# Patient Record
Sex: Male | Born: 1945 | Race: White | Hispanic: No | Marital: Single | Smoking: Never smoker
Health system: Southern US, Community
[De-identification: ages and names within clinical notes are randomized; demographics above are authoritative.]

## PROBLEM LIST (undated history)

## (undated) DIAGNOSIS — Z789 Other specified health status: Secondary | ICD-10-CM

---

## 1968-09-08 HISTORY — PX: SPLENECTOMY, TOTAL: SHX788

## 2008-06-17 ENCOUNTER — Emergency Department (HOSPITAL_COMMUNITY): Admission: EM | Admit: 2008-06-17 | Discharge: 2008-06-17 | Payer: Self-pay | Admitting: Emergency Medicine

## 2012-01-21 ENCOUNTER — Ambulatory Visit (HOSPITAL_COMMUNITY)
Admission: RE | Admit: 2012-01-21 | Discharge: 2012-01-21 | Disposition: A | Discharge status: Home / Self Care | Payer: Medicare Other | Source: Ambulatory Visit | Attending: Family Medicine | Admitting: Family Medicine

## 2012-01-21 ENCOUNTER — Other Ambulatory Visit (HOSPITAL_COMMUNITY): Payer: Self-pay | Admitting: Family Medicine

## 2012-01-21 DIAGNOSIS — R05 Cough: Secondary | ICD-10-CM

## 2012-01-21 DIAGNOSIS — R062 Wheezing: Secondary | ICD-10-CM

## 2012-01-21 DIAGNOSIS — R059 Cough, unspecified: Secondary | ICD-10-CM | POA: Insufficient documentation

## 2012-04-09 ENCOUNTER — Ambulatory Visit (HOSPITAL_COMMUNITY)
Admission: RE | Admit: 2012-04-09 | Discharge: 2012-04-09 | Disposition: A | Discharge status: Home / Self Care | Payer: Medicare Other | Source: Ambulatory Visit | Attending: Family Medicine | Admitting: Family Medicine

## 2012-04-09 ENCOUNTER — Other Ambulatory Visit (HOSPITAL_COMMUNITY): Payer: Self-pay | Admitting: Family Medicine

## 2012-04-09 DIAGNOSIS — R05 Cough: Secondary | ICD-10-CM

## 2012-04-09 DIAGNOSIS — R059 Cough, unspecified: Secondary | ICD-10-CM | POA: Insufficient documentation

## 2012-11-03 ENCOUNTER — Encounter (HOSPITAL_COMMUNITY): Payer: Self-pay

## 2012-11-03 ENCOUNTER — Encounter (HOSPITAL_COMMUNITY)
Admission: RE | Admit: 2012-11-03 | Discharge: 2012-11-03 | Disposition: A | Discharge status: Home / Self Care | Payer: Medicare Other | Source: Ambulatory Visit | Attending: Ophthalmology | Admitting: Ophthalmology

## 2012-11-03 ENCOUNTER — Encounter (HOSPITAL_COMMUNITY): Payer: Self-pay | Admitting: Pharmacy Technician

## 2012-11-03 ENCOUNTER — Other Ambulatory Visit: Payer: Self-pay

## 2012-11-03 HISTORY — DX: Other specified health status: Z78.9

## 2012-11-03 NOTE — Patient Instructions (Addendum)
Your procedure is scheduled on:  11/11/12  Report to [facility] at 7:30 AM.  Call this number if you have problems the morning of surgery: [phone]   Remember:   Do not eat or drink:After Midnight.  Take these medicines the morning of surgery with A SIP OF WATER: None   Do not wear jewelry, make-up or nail polish.  Do not wear lotions, powders, or perfumes. You may wear deodorant.  Do not shave 48 hours prior to surgery. Men may shave face and neck.  Do not bring valuables to the hospital.  Contacts, dentures or bridgework may not be worn into surgery.  Leave suitcase in the car. After surgery it may be brought to your room.  For patients admitted to the hospital, checkout time is 11:00 AM the day of discharge.   Patients discharged the day of surgery will not be allowed to drive home.    Special Instructions: Start using your eye drops before surgery as directed by your eye doctor.   Please read over the following fact sheets that you were given: Anesthesia Post-op Instructions    Cataract Surgery  A cataract is a clouding of the lens of the eye. When a lens becomes cloudy, vision is reduced based on the degree and nature of the clouding. Surgery may be needed to improve vision. Surgery removes the cloudy lens and usually replaces it with a substitute lens (intraocular lens, IOL). LET YOUR EYE DOCTOR KNOW ABOUT:  Allergies to food or medicine.  Medicines taken including herbs, eyedrops, over-the-counter medicines, and creams.  Use of steroids (by mouth or creams).  Previous problems with anesthetics or numbing medicine.  History of bleeding problems or blood clots.  Previous surgery.  Other health problems, including diabetes and kidney problems.  Possibility of pregnancy, if this applies. RISKS AND COMPLICATIONS  Infection.  Inflammation of the eyeball (endophthalmitis) that can spread to both eyes (sympathetic ophthalmia).  Poor wound healing.  If an IOL is  inserted, it can later fall out of proper position. This is very uncommon.  Clouding of the part of your eye that holds an IOL in place. This is called an "after-cataract." These are uncommon, but easily treated. BEFORE THE PROCEDURE  Do not eat or drink anything except small amounts of water for 8 to 12 before your surgery, or as directed by your caregiver.  Unless you are told otherwise, continue any eyedrops you have been prescribed.  Talk to your primary caregiver about all other medicines that you take (both prescription and non-prescription). In some cases, you may need to stop or change medicines near the time of your surgery. This is most important if you are taking blood-thinning medicine.Do not stop medicines unless you are told to do so.  Arrange for someone to drive you to and from the procedure.  Do not put contact lenses in either eye on the day of your surgery. PROCEDURE There is more than one method for safely removing a cataract. Your doctor can explain the differences and help determine which is best for you. Phacoemulsification surgery is the most common form of cataract surgery.  An injection is given behind the eye or eyedrops are given to make this a painless procedure.  A small cut (incision) is made on the edge of the clear, dome-shaped surface that covers the front of the eye (cornea).  A tiny probe is painlessly inserted into the eye. This device gives off ultrasound waves that soften and break up the cloudy  center of the lens. This makes it easier for the cloudy lens to be removed by suction.  An IOL may be implanted.  The normal lens of the eye is covered by a clear capsule. Part of that capsule is intentionally left in the eye to support the IOL.  Your surgeon may or may not use stitches to close the incision. There are other forms of cataract surgery that require a larger incision and stiches to close the eye. This approach is taken in cases where the  doctor feels that the cataract cannot be easily removed using phacoemulsification. AFTER THE PROCEDURE  When an IOL is implanted, it does not need care. It becomes a permanent part of your eye and cannot be seen or felt.  Your doctor will schedule follow-up exams to check on your progress.  Review your other medicines with your doctor to see which can be resumed after surgery.  Use eyedrops or take medicine as prescribed by your doctor. Document Released: 08/14/2011 Document Revised: 11/17/2011 Document Reviewed: 08/14/2011 [facility] Patient Information 2013 [place], [place].    PATIENT INSTRUCTIONS POST-ANESTHESIA  IMMEDIATELY FOLLOWING SURGERY:  Do not drive or operate machinery for the first twenty four hours after surgery.  Do not make any important decisions for twenty four hours after surgery or while taking narcotic pain medications or sedatives.  If you develop intractable nausea and vomiting or a severe headache please notify your doctor immediately.  FOLLOW-UP:  Please make an appointment with your surgeon as instructed. You do not need to follow up with anesthesia unless specifically instructed to do so.  WOUND CARE INSTRUCTIONS (if applicable):  Keep a dry clean dressing on the anesthesia/puncture wound site if there is drainage.  Once the wound has quit draining you may leave it open to air.  Generally you should leave the bandage intact for twenty four hours unless there is drainage.  If the epidural site drains for more than 36-48 hours please call the anesthesia department.  QUESTIONS?:  Please feel free to call your physician or the hospital operator if you have any questions, and they will be happy to assist you.

## 2012-11-10 MED ORDER — CYCLOPENTOLATE-PHENYLEPHRINE 0.2-1 % OP SOLN
OPHTHALMIC | Status: AC
  Filled 2012-11-10: qty 2

## 2012-11-10 MED ORDER — PHENYLEPHRINE HCL 2.5 % OP SOLN
OPHTHALMIC | Status: AC
  Filled 2012-11-10: qty 2

## 2012-11-10 MED ORDER — NEOMYCIN-POLYMYXIN-DEXAMETH 3.5-10000-0.1 OP OINT
TOPICAL_OINTMENT | OPHTHALMIC | Status: AC
  Filled 2012-11-10: qty 3.5

## 2012-11-10 MED ORDER — TETRACAINE HCL 0.5 % OP SOLN
OPHTHALMIC | Status: AC
  Filled 2012-11-10: qty 2

## 2012-11-10 MED ORDER — LIDOCAINE HCL 3.5 % OP GEL
OPHTHALMIC | Status: AC
  Filled 2012-11-10: qty 5

## 2012-11-10 MED ORDER — LIDOCAINE HCL (PF) 1 % IJ SOLN
INTRAMUSCULAR | Status: AC
  Filled 2012-11-10: qty 2

## 2012-11-11 ENCOUNTER — Ambulatory Visit (HOSPITAL_COMMUNITY)
Admission: RE | Admit: 2012-11-11 | Discharge: 2012-11-11 | Disposition: A | Discharge status: Home / Self Care | Payer: Medicare Other | Source: Ambulatory Visit | Attending: Ophthalmology | Admitting: Ophthalmology

## 2012-11-11 ENCOUNTER — Encounter (HOSPITAL_COMMUNITY): Payer: Self-pay | Admitting: Anesthesiology

## 2012-11-11 ENCOUNTER — Ambulatory Visit (HOSPITAL_COMMUNITY): Payer: Medicare Other | Admitting: Anesthesiology

## 2012-11-11 ENCOUNTER — Encounter (HOSPITAL_COMMUNITY): Payer: Self-pay | Admitting: *Deleted

## 2012-11-11 ENCOUNTER — Encounter (HOSPITAL_COMMUNITY)
Admission: RE | Disposition: A | Discharge status: Home / Self Care | Payer: Self-pay | Source: Ambulatory Visit | Attending: Ophthalmology

## 2012-11-11 DIAGNOSIS — I10 Essential (primary) hypertension: Secondary | ICD-10-CM | POA: Insufficient documentation

## 2012-11-11 DIAGNOSIS — H2589 Other age-related cataract: Secondary | ICD-10-CM | POA: Insufficient documentation

## 2012-11-11 DIAGNOSIS — Z79899 Other long term (current) drug therapy: Secondary | ICD-10-CM | POA: Insufficient documentation

## 2012-11-11 DIAGNOSIS — Z01812 Encounter for preprocedural laboratory examination: Secondary | ICD-10-CM | POA: Insufficient documentation

## 2012-11-11 HISTORY — PX: CATARACT EXTRACTION W/PHACO: SHX586

## 2012-11-11 SURGERY — PHACOEMULSIFICATION, CATARACT, WITH IOL INSERTION
Anesthesia: Monitor Anesthesia Care | Site: Eye | Laterality: Right | Wound class: Clean

## 2012-11-11 MED ORDER — TETRACAINE HCL 0.5 % OP SOLN
1.0000 [drp] | OPHTHALMIC | Status: AC
  Administered 2012-11-11 (×3): 1 [drp] via OPHTHALMIC

## 2012-11-11 MED ORDER — EPINEPHRINE HCL 1 MG/ML IJ SOLN
INTRAOCULAR | Status: DC | PRN
  Administered 2012-11-11: 09:00:00

## 2012-11-11 MED ORDER — NEOMYCIN-POLYMYXIN-DEXAMETH 0.1 % OP OINT
TOPICAL_OINTMENT | OPHTHALMIC | Status: DC | PRN
  Administered 2012-11-11: 1 via OPHTHALMIC

## 2012-11-11 MED ORDER — PHENYLEPHRINE HCL 2.5 % OP SOLN
1.0000 [drp] | OPHTHALMIC | Status: AC
  Administered 2012-11-11 (×3): 1 [drp] via OPHTHALMIC

## 2012-11-11 MED ORDER — POVIDONE-IODINE 5 % OP SOLN
OPHTHALMIC | Status: DC | PRN
  Administered 2012-11-11: 1 via OPHTHALMIC

## 2012-11-11 MED ORDER — LIDOCAINE HCL (PF) 1 % IJ SOLN
INTRAMUSCULAR | Status: DC | PRN
  Administered 2012-11-11: .5 mL

## 2012-11-11 MED ORDER — MIDAZOLAM HCL 2 MG/2ML IJ SOLN
INTRAMUSCULAR | Status: AC
  Filled 2012-11-11: qty 2

## 2012-11-11 MED ORDER — LIDOCAINE 3.5 % OP GEL OPTIME - NO CHARGE
OPHTHALMIC | Status: DC | PRN
  Administered 2012-11-11: 1 [drp] via OPHTHALMIC

## 2012-11-11 MED ORDER — LIDOCAINE HCL 3.5 % OP GEL
1.0000 "application " | Freq: Once | OPHTHALMIC | Status: AC
  Administered 2012-11-11: 1 via OPHTHALMIC

## 2012-11-11 MED ORDER — CYCLOPENTOLATE-PHENYLEPHRINE 0.2-1 % OP SOLN
1.0000 [drp] | OPHTHALMIC | Status: AC
  Administered 2012-11-11 (×3): 1 [drp] via OPHTHALMIC

## 2012-11-11 MED ORDER — MIDAZOLAM HCL 2 MG/2ML IJ SOLN
1.0000 mg | INTRAMUSCULAR | Status: DC | PRN
  Administered 2012-11-11: 2 mg via INTRAVENOUS

## 2012-11-11 MED ORDER — BSS IO SOLN
INTRAOCULAR | Status: DC | PRN
  Administered 2012-11-11: 15 mL via INTRAOCULAR

## 2012-11-11 MED ORDER — LACTATED RINGERS IV SOLN
INTRAVENOUS | Status: DC
  Administered 2012-11-11: 08:00:00 via INTRAVENOUS

## 2012-11-11 MED ORDER — EPINEPHRINE HCL 1 MG/ML IJ SOLN
INTRAMUSCULAR | Status: AC
  Filled 2012-11-11: qty 1

## 2012-11-11 MED ORDER — NA HYALUR & NA CHOND-NA HYALUR 0.55-0.5 ML IO KIT
PACK | INTRAOCULAR | Status: DC | PRN
  Administered 2012-11-11: 1 via OPHTHALMIC

## 2012-11-11 SURGICAL SUPPLY — 32 items
CAPSULAR TENSION RING-AMO (OPHTHALMIC RELATED) IMPLANT
CLOTH BEACON ORANGE TIMEOUT ST (SAFETY) ×2 IMPLANT
EYE SHIELD UNIVERSAL CLEAR (GAUZE/BANDAGES/DRESSINGS) ×2 IMPLANT
GLOVE BIO SURGEON STRL SZ 6.5 (GLOVE) IMPLANT
GLOVE BIOGEL PI IND STRL 6.5 (GLOVE) ×2 IMPLANT
GLOVE BIOGEL PI IND STRL 7.0 (GLOVE) IMPLANT
GLOVE BIOGEL PI IND STRL 7.5 (GLOVE) IMPLANT
GLOVE BIOGEL PI INDICATOR 6.5 (GLOVE) ×2
GLOVE BIOGEL PI INDICATOR 7.0 (GLOVE)
GLOVE BIOGEL PI INDICATOR 7.5 (GLOVE)
GLOVE ECLIPSE 6.5 STRL STRAW (GLOVE) IMPLANT
GLOVE ECLIPSE 7.0 STRL STRAW (GLOVE) IMPLANT
GLOVE ECLIPSE 7.5 STRL STRAW (GLOVE) IMPLANT
GLOVE EXAM NITRILE LRG STRL (GLOVE) IMPLANT
GLOVE EXAM NITRILE MD LF STRL (GLOVE) ×2 IMPLANT
GLOVE SKINSENSE NS SZ6.5 (GLOVE)
GLOVE SKINSENSE NS SZ7.0 (GLOVE)
GLOVE SKINSENSE STRL SZ6.5 (GLOVE) IMPLANT
GLOVE SKINSENSE STRL SZ7.0 (GLOVE) IMPLANT
KIT VITRECTOMY (OPHTHALMIC RELATED) IMPLANT
PAD ARMBOARD 7.5X6 YLW CONV (MISCELLANEOUS) ×2 IMPLANT
PROC W NO LENS (INTRAOCULAR LENS)
PROC W SPEC LENS (INTRAOCULAR LENS)
PROCESS W NO LENS (INTRAOCULAR LENS) IMPLANT
PROCESS W SPEC LENS (INTRAOCULAR LENS) IMPLANT
RING MALYGIN (MISCELLANEOUS) IMPLANT
SIGHTPATH CAT PROC W REG LENS (Ophthalmic Related) ×2 IMPLANT
SYR TB 1ML LL NO SAFETY (SYRINGE) ×2 IMPLANT
TAPE SURG TRANSPORE 1 IN (GAUZE/BANDAGES/DRESSINGS) ×1 IMPLANT
TAPE SURGICAL TRANSPORE 1 IN (GAUZE/BANDAGES/DRESSINGS) ×1
VISCOELASTIC ADDITIONAL (OPHTHALMIC RELATED) IMPLANT
WATER STERILE IRR 250ML POUR (IV SOLUTION) ×2 IMPLANT

## 2012-11-11 NOTE — Anesthesia Procedure Notes (Signed)
Procedure Name: MAC Date/Time: 11/11/2012 9:00 AM Performed by: Franco Nones Pre-anesthesia Checklist: Patient identified, Emergency Drugs available, Suction available, Timeout performed and Patient being monitored Patient Re-evaluated:Patient Re-evaluated prior to inductionOxygen Delivery Method: Nasal Cannula

## 2012-11-11 NOTE — Transfer of Care (Signed)
Immediate Anesthesia Transfer of Care Note  Patient: Spencer Vargas  Procedure(s) Performed: Procedure(s) (LRB): CATARACT EXTRACTION PHACO AND INTRAOCULAR LENS PLACEMENT (IOC) (Right)  Patient Location: Shortstay  Anesthesia Type: MAC  Level of Consciousness: awake  Airway & Oxygen Therapy: Patient Spontanous Breathing   Post-op Assessment: Report given to PACU RN, Post -op Vital signs reviewed and stable and Patient moving all extremities  Post vital signs: Reviewed and stable  Complications: No apparent anesthesia complications

## 2012-11-11 NOTE — Anesthesia Postprocedure Evaluation (Signed)
  Anesthesia Post-op Note  Patient: Spencer Vargas  Procedure(s) Performed: Procedure(s) (LRB): CATARACT EXTRACTION PHACO AND INTRAOCULAR LENS PLACEMENT (IOC) (Right)  Patient Location:  Short Stay  Anesthesia Type: MAC  Level of Consciousness: awake  Airway and Oxygen Therapy: Patient Spontanous Breathing  Post-op Pain: none  Post-op Assessment: Post-op Vital signs reviewed, Patient's Cardiovascular Status Stable, Respiratory Function Stable, Patent Airway, No signs of Nausea or vomiting and Pain level controlled  Post-op Vital Signs: Reviewed and stable  Complications: No apparent anesthesia complications

## 2012-11-11 NOTE — Anesthesia Preprocedure Evaluation (Signed)
Anesthesia Evaluation  Patient identified by MRN, date of birth, ID band Patient awake    Reviewed: Allergy & Precautions, H&P , NPO status , Patient's Chart, lab work & pertinent test results  History of Anesthesia Complications Negative for: history of anesthetic complications  Airway Mallampati: II      Dental  (+) Teeth Intact   Pulmonary neg pulmonary ROS,  breath sounds clear to auscultation        Cardiovascular hypertension (borderline), Rhythm:Regular Rate:Normal     Neuro/Psych    GI/Hepatic negative GI ROS,   Endo/Other    Renal/GU      Musculoskeletal   Abdominal   Peds  Hematology   Anesthesia Other Findings   Reproductive/Obstetrics                           Anesthesia Physical Anesthesia Plan  ASA: II  Anesthesia Plan: MAC   Post-op Pain Management:    Induction: Intravenous  Airway Management Planned:   Additional Equipment:   Intra-op Plan:   Post-operative Plan:   Informed Consent: I have reviewed the patients History and Physical, chart, labs and discussed the procedure including the risks, benefits and alternatives for the proposed anesthesia with the patient or authorized representative who has indicated his/her understanding and acceptance.     Plan Discussed with:   Anesthesia Plan Comments:         Anesthesia Quick Evaluation

## 2012-11-11 NOTE — H&P (Signed)
I have reviewed the H&P, the patient was re-examined, and I have identified no interval changes in medical condition and plan of care since the history and physical of record  

## 2012-11-11 NOTE — Op Note (Signed)
Spencer Vargas, Spencer Vargas             ACCOUNT NO.:  [id number]  MEDICAL RECORD NO.:  [id number]  LOCATION:  [facility]                          FACILITY:  [facility]  PHYSICIAN:  Susanne Greenhouse, MD       DATE OF BIRTH:  1946/02/07  DATE OF PROCEDURE:  11/11/2012 DATE OF DISCHARGE:  11/11/2012                              OPERATIVE REPORT   PREOPERATIVE DIAGNOSIS:  Combined cataract, right eye, diagnosis code 366.19.  POSTOPERATIVE DIAGNOSIS:  Combined cataract, right eye, diagnosis code 366.19.  PROCEDURE:  Phacoemulsification with posterior chamber intraocular lens implantation, right eye.  SURGEON:  Bonne Dolores. Ariabella Brien, MD  ANESTHESIA:  Topical with monitored anesthesia care and IV sedation.  OPERATIVE SUMMARY:  In the preoperative area, dilating drops were placed into the right eye.  The patient was then brought into the operating room where he was placed under general anesthesia.  The eye was then prepped and draped.  Beginning with a 75 blade, a paracentesis port was made at the surgeon's 2 o'clock position.  The anterior chamber was then filled with a 1% nonpreserved lidocaine solution with epinephrine.  This was followed by Viscoat to deepen the chamber.  A small fornix-based peritomy was performed superiorly.  Next, a single iris hook was placed through the limbus superiorly.  A 2.4-mm keratome blade was then used to make a clear corneal incision over the iris hook.  A bent cystotome needle and Utrata forceps were used to create a continuous tear capsulotomy.  Hydrodissection was performed using balanced salt solution on a fine cannula.  The lens nucleus was then removed using phacoemulsification in a quadrant cracking technique.  The cortical material was then removed with irrigation and aspiration.  The capsular bag and anterior chamber were refilled with Provisc.  The wound was widened to approximately 3 mm and a posterior chamber intraocular lens was placed into the capsular bag without  difficulty using an [employer] lens injecting system.  A single 10-0 nylon suture was then used to close the incision as well as stromal hydration.  The Provisc was removed from the anterior chamber and capsular bag with irrigation and aspiration.  At this point, the wounds were tested for leak, which were negative.  The anterior chamber remained deep and stable.  The patient tolerated the procedure well.  There were no operative complications, and he awoke from general anesthesia without problem.  No surgical specimens.  Prosthetic device used is a Theme park manager, model EnVista, model number MX60, power of 16.0, serial number is [id number].          ______________________________ Susanne Greenhouse, MD     KEH/MEDQ  D:  11/11/2012  T:  11/11/2012  Job:  [id number]

## 2012-11-11 NOTE — Brief Op Note (Signed)
Pre-Op Dx: Cataract OD Post-Op Dx: Cataract OD Surgeon: Hunt, Kerry Anesthesia: Topical with MAC Surgery: Cataract Extraction with Intraocular lens Implant OD Implant: B&L enVista Specimen: None Complications: None 

## 2012-11-15 ENCOUNTER — Encounter (HOSPITAL_COMMUNITY): Payer: Self-pay | Admitting: Ophthalmology

## 2012-11-22 ENCOUNTER — Encounter (HOSPITAL_COMMUNITY): Payer: Self-pay | Admitting: Pharmacy Technician

## 2012-11-24 ENCOUNTER — Encounter (HOSPITAL_COMMUNITY)
Admission: RE | Admit: 2012-11-24 | Discharge: 2012-11-24 | Disposition: A | Discharge status: Home / Self Care | Payer: Medicare Other | Source: Ambulatory Visit | Attending: Ophthalmology | Admitting: Ophthalmology

## 2012-11-24 MED ORDER — FENTANYL CITRATE 0.05 MG/ML IJ SOLN: 25.0000 ug | INTRAMUSCULAR | Status: DC | PRN

## 2012-11-24 MED ORDER — ONDANSETRON HCL 4 MG/2ML IJ SOLN: 4.0000 mg | Freq: Once | INTRAMUSCULAR | Status: AC | PRN

## 2012-11-26 MED ORDER — NEOMYCIN-POLYMYXIN-DEXAMETH 3.5-10000-0.1 OP OINT
TOPICAL_OINTMENT | OPHTHALMIC | Status: AC
  Filled 2012-11-26: qty 3.5

## 2012-11-26 MED ORDER — CYCLOPENTOLATE-PHENYLEPHRINE 0.2-1 % OP SOLN
OPHTHALMIC | Status: AC
  Filled 2012-11-26: qty 2

## 2012-11-26 MED ORDER — PHENYLEPHRINE HCL 2.5 % OP SOLN
OPHTHALMIC | Status: AC
  Filled 2012-11-26: qty 2

## 2012-11-26 MED ORDER — LIDOCAINE HCL (PF) 1 % IJ SOLN
INTRAMUSCULAR | Status: AC
  Filled 2012-11-26: qty 2

## 2012-11-26 MED ORDER — LIDOCAINE HCL 3.5 % OP GEL
OPHTHALMIC | Status: AC
  Filled 2012-11-26: qty 5

## 2012-11-26 MED ORDER — TETRACAINE HCL 0.5 % OP SOLN
OPHTHALMIC | Status: AC
  Filled 2012-11-26: qty 2

## 2012-11-29 ENCOUNTER — Encounter (HOSPITAL_COMMUNITY): Payer: Self-pay | Admitting: *Deleted

## 2012-11-29 ENCOUNTER — Ambulatory Visit (HOSPITAL_COMMUNITY)
Admission: RE | Admit: 2012-11-29 | Discharge: 2012-11-29 | Disposition: A | Discharge status: Home / Self Care | Payer: Medicare Other | Source: Ambulatory Visit | Attending: Ophthalmology | Admitting: Ophthalmology

## 2012-11-29 ENCOUNTER — Encounter (HOSPITAL_COMMUNITY)
Admission: RE | Disposition: A | Discharge status: Home / Self Care | Payer: Self-pay | Source: Ambulatory Visit | Attending: Ophthalmology

## 2012-11-29 ENCOUNTER — Encounter (HOSPITAL_COMMUNITY): Payer: Self-pay | Admitting: Anesthesiology

## 2012-11-29 ENCOUNTER — Ambulatory Visit (HOSPITAL_COMMUNITY): Payer: Medicare Other | Admitting: Anesthesiology

## 2012-11-29 DIAGNOSIS — Z91038 Other insect allergy status: Secondary | ICD-10-CM | POA: Insufficient documentation

## 2012-11-29 DIAGNOSIS — R030 Elevated blood-pressure reading, without diagnosis of hypertension: Secondary | ICD-10-CM | POA: Insufficient documentation

## 2012-11-29 DIAGNOSIS — H269 Unspecified cataract: Secondary | ICD-10-CM | POA: Insufficient documentation

## 2012-11-29 DIAGNOSIS — Z7982 Long term (current) use of aspirin: Secondary | ICD-10-CM | POA: Insufficient documentation

## 2012-11-29 HISTORY — PX: CATARACT EXTRACTION W/PHACO: SHX586

## 2012-11-29 SURGERY — PHACOEMULSIFICATION, CATARACT, WITH IOL INSERTION
Anesthesia: Monitor Anesthesia Care | Site: Eye | Laterality: Left | Wound class: Clean

## 2012-11-29 MED ORDER — MIDAZOLAM HCL 2 MG/2ML IJ SOLN
1.0000 mg | INTRAMUSCULAR | Status: DC | PRN
  Administered 2012-11-29: 2 mg via INTRAVENOUS

## 2012-11-29 MED ORDER — LIDOCAINE HCL (PF) 1 % IJ SOLN
INTRAMUSCULAR | Status: DC | PRN
  Administered 2012-11-29: .4 mL

## 2012-11-29 MED ORDER — TETRACAINE HCL 0.5 % OP SOLN
1.0000 [drp] | OPHTHALMIC | Status: AC
  Administered 2012-11-29 (×3): 1 [drp] via OPHTHALMIC

## 2012-11-29 MED ORDER — PHENYLEPHRINE HCL 2.5 % OP SOLN
1.0000 [drp] | OPHTHALMIC | Status: AC
  Administered 2012-11-29 (×3): 1 [drp] via OPHTHALMIC

## 2012-11-29 MED ORDER — NEOMYCIN-POLYMYXIN-DEXAMETH 0.1 % OP OINT
TOPICAL_OINTMENT | OPHTHALMIC | Status: DC | PRN
  Administered 2012-11-29: 1 via OPHTHALMIC

## 2012-11-29 MED ORDER — FENTANYL CITRATE 0.05 MG/ML IJ SOLN: 25.0000 ug | INTRAMUSCULAR | Status: DC | PRN

## 2012-11-29 MED ORDER — BSS IO SOLN
INTRAOCULAR | Status: DC | PRN
  Administered 2012-11-29: 15 mL via INTRAOCULAR

## 2012-11-29 MED ORDER — NA HYALUR & NA CHOND-NA HYALUR 0.55-0.5 ML IO KIT
PACK | INTRAOCULAR | Status: DC | PRN
  Administered 2012-11-29: 1 via OPHTHALMIC

## 2012-11-29 MED ORDER — EPINEPHRINE HCL 1 MG/ML IJ SOLN
INTRAOCULAR | Status: DC | PRN
  Administered 2012-11-29: 10:00:00

## 2012-11-29 MED ORDER — EPINEPHRINE HCL 1 MG/ML IJ SOLN
INTRAMUSCULAR | Status: AC
  Filled 2012-11-29: qty 1

## 2012-11-29 MED ORDER — ONDANSETRON HCL 4 MG/2ML IJ SOLN: 4.0000 mg | Freq: Once | INTRAMUSCULAR | Status: DC | PRN

## 2012-11-29 MED ORDER — MIDAZOLAM HCL 2 MG/2ML IJ SOLN
INTRAMUSCULAR | Status: AC
  Filled 2012-11-29: qty 2

## 2012-11-29 MED ORDER — CYCLOPENTOLATE-PHENYLEPHRINE 0.2-1 % OP SOLN
1.0000 [drp] | OPHTHALMIC | Status: AC
  Administered 2012-11-29 (×3): 1 [drp] via OPHTHALMIC

## 2012-11-29 MED ORDER — POVIDONE-IODINE 5 % OP SOLN
OPHTHALMIC | Status: DC | PRN
  Administered 2012-11-29: 1 via OPHTHALMIC

## 2012-11-29 MED ORDER — LACTATED RINGERS IV SOLN
INTRAVENOUS | Status: DC
  Administered 2012-11-29: 10:00:00 via INTRAVENOUS

## 2012-11-29 MED ORDER — LIDOCAINE HCL 3.5 % OP GEL
1.0000 "application " | Freq: Once | OPHTHALMIC | Status: AC
  Administered 2012-11-29: 1 via OPHTHALMIC

## 2012-11-29 SURGICAL SUPPLY — 32 items

## 2012-11-29 NOTE — Anesthesia Postprocedure Evaluation (Signed)
  Anesthesia Post-op Note  Patient: Spencer Vargas  Procedure(s) Performed: Procedure(s) with comments: CATARACT EXTRACTION PHACO AND INTRAOCULAR LENS PLACEMENT (IOC) (Left) - CDE 58.63  Patient Location: PACU and Short Stay  Anesthesia Type:MAC  Level of Consciousness: awake, alert  and oriented  Airway and Oxygen Therapy: Patient Spontanous Breathing  Post-op Pain: none  Post-op Assessment: Post-op Vital signs reviewed, Patient's Cardiovascular Status Stable, Respiratory Function Stable, Patent Airway and No signs of Nausea or vomiting  Post-op Vital Signs: Reviewed and stable  Complications: No apparent anesthesia complications

## 2012-11-29 NOTE — Op Note (Signed)
Date of Admission: Today  Date of Surgery: Today  Pre-Op Dx: Cataract Left Eye  Post-Op Dx: Cataract Left Eye  Surgeon: Gemma Payor, M.D.  Assistants: None  Anesthesia: Topical with MAC  Indications: Painless, progressive loss of vision with compromise of daily activities.  Surgery: Cataract Extraction with Intraocular lens Implant Left Eye  Discription: The patient had dilating drops and viscous lidocaine placed into the left eye in the pre-op holding area. After transfer to the operating room, a time out was performed. The patient was then prepped and draped. Beginning with a 75 degree blade a paracentesis port was made at the surgeon's 2 o'clock position. The anterior chamber was then filled with 2% non-preserved lidocaine. This was followed by filling the anterior chamber with Viscoat. A bent cystatome needle was used to create a continuous tear capsulotomy. Hydrodissection was performed with balanced salt solution on a Fine canula. The lens nucleus was then removed using the phacoemulsification handpiece. Residual cortex was removed with the I&A handpiece. The anterior chamber and capsular bag were refilled with Provisc. A posterior chamber intraocular lens was placed into the capsular bag with it's injector. The implant was positioned with the Kuglan hook. The Provisc was then removed from the anterior chamber and capsular bag with the I&A handpiece. Stromal hydration of the main incision and paracentesis port was performed with BSS on a Fine canula. The wounds were tested for leak which was negative. The patient tolerated the procedure well. There were no operative complications. The patient was then transferred to the recovery room in stable condition.  Prosthetic device:  B&L enVista, power 15.5.  Specimen: None  EBL: None  Complications: None

## 2012-11-29 NOTE — Anesthesia Preprocedure Evaluation (Signed)
Anesthesia Evaluation  Patient identified by MRN, date of birth, ID band Patient awake    Reviewed: Allergy & Precautions, H&P , NPO status , Patient's Chart, lab work & pertinent test results  History of Anesthesia Complications Negative for: history of anesthetic complications  Airway Mallampati: II      Dental  (+) Teeth Intact   Pulmonary neg pulmonary ROS,  breath sounds clear to auscultation        Cardiovascular hypertension (borderline), Rhythm:Regular Rate:Normal     Neuro/Psych    GI/Hepatic negative GI ROS,   Endo/Other    Renal/GU      Musculoskeletal   Abdominal   Peds  Hematology   Anesthesia Other Findings   Reproductive/Obstetrics                           Anesthesia Physical Anesthesia Plan  ASA: I  Anesthesia Plan: MAC   Post-op Pain Management:    Induction:   Airway Management Planned: Nasal Cannula  Additional Equipment:   Intra-op Plan:   Post-operative Plan:   Informed Consent: I have reviewed the patients History and Physical, chart, labs and discussed the procedure including the risks, benefits and alternatives for the proposed anesthesia with the patient or authorized representative who has indicated his/her understanding and acceptance.     Plan Discussed with: CRNA  Anesthesia Plan Comments:         Anesthesia Quick Evaluation

## 2012-11-29 NOTE — Transfer of Care (Signed)
Immediate Anesthesia Transfer of Care Note  Patient: Spencer Vargas  Procedure(s) Performed: Procedure(s) with comments: CATARACT EXTRACTION PHACO AND INTRAOCULAR LENS PLACEMENT (IOC) (Left) - CDE 58.63  Patient Location: PACU and Short Stay  Anesthesia Type:MAC  Level of Consciousness: awake  Airway & Oxygen Therapy: Patient Spontanous Breathing  Post-op Assessment: Report given to PACU RN  Post vital signs: Reviewed  Complications: No apparent anesthesia complications

## 2012-11-29 NOTE — H&P (Signed)
I have reviewed the H&P, the patient was re-examined, and I have identified no interval changes in medical condition and plan of care since the history and physical of record  

## 2012-12-01 ENCOUNTER — Encounter (HOSPITAL_COMMUNITY): Payer: Self-pay | Admitting: Ophthalmology

## 2013-09-13 IMAGING — CR DG CHEST 2V
2 series · 2 of 2 positions shown · non-contrast
Comparison: None.

CLINICAL DATA: Cough and wheezing.

CHEST - 2 VIEW

[view not recorded (1 of 2)]
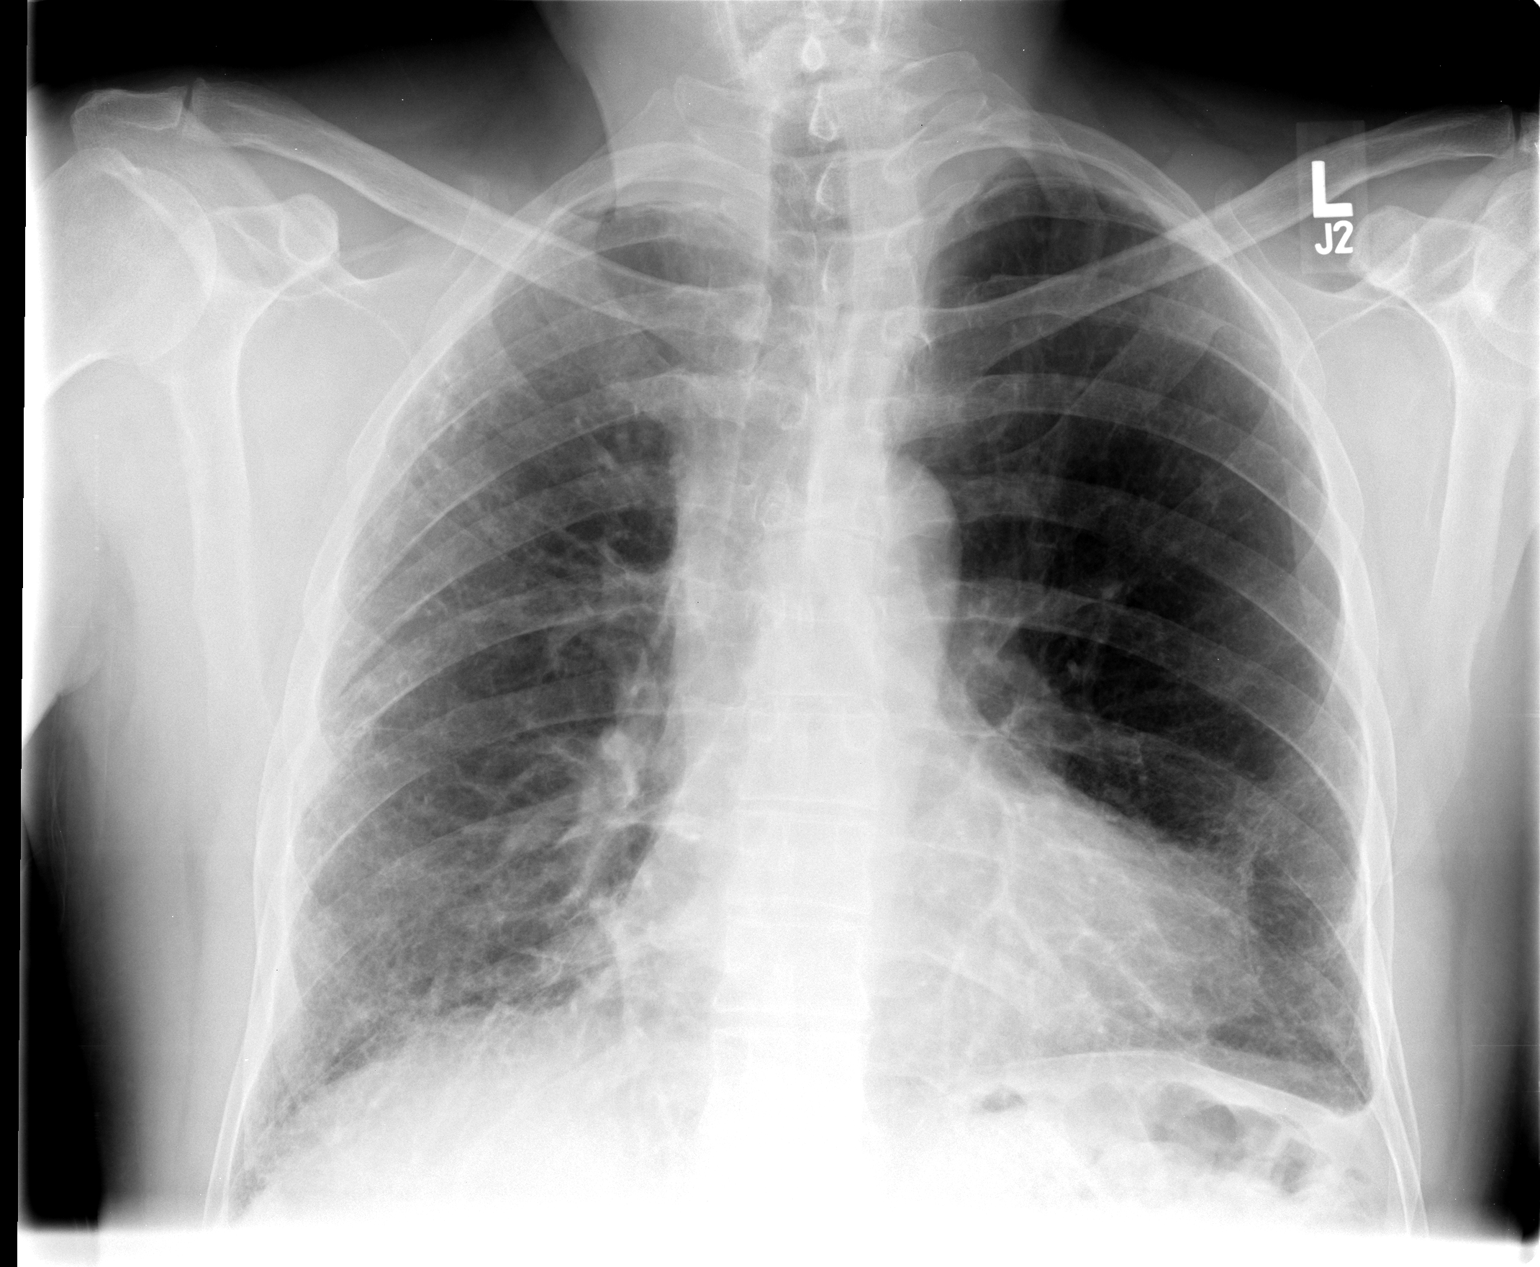

[view not recorded (2 of 2)]
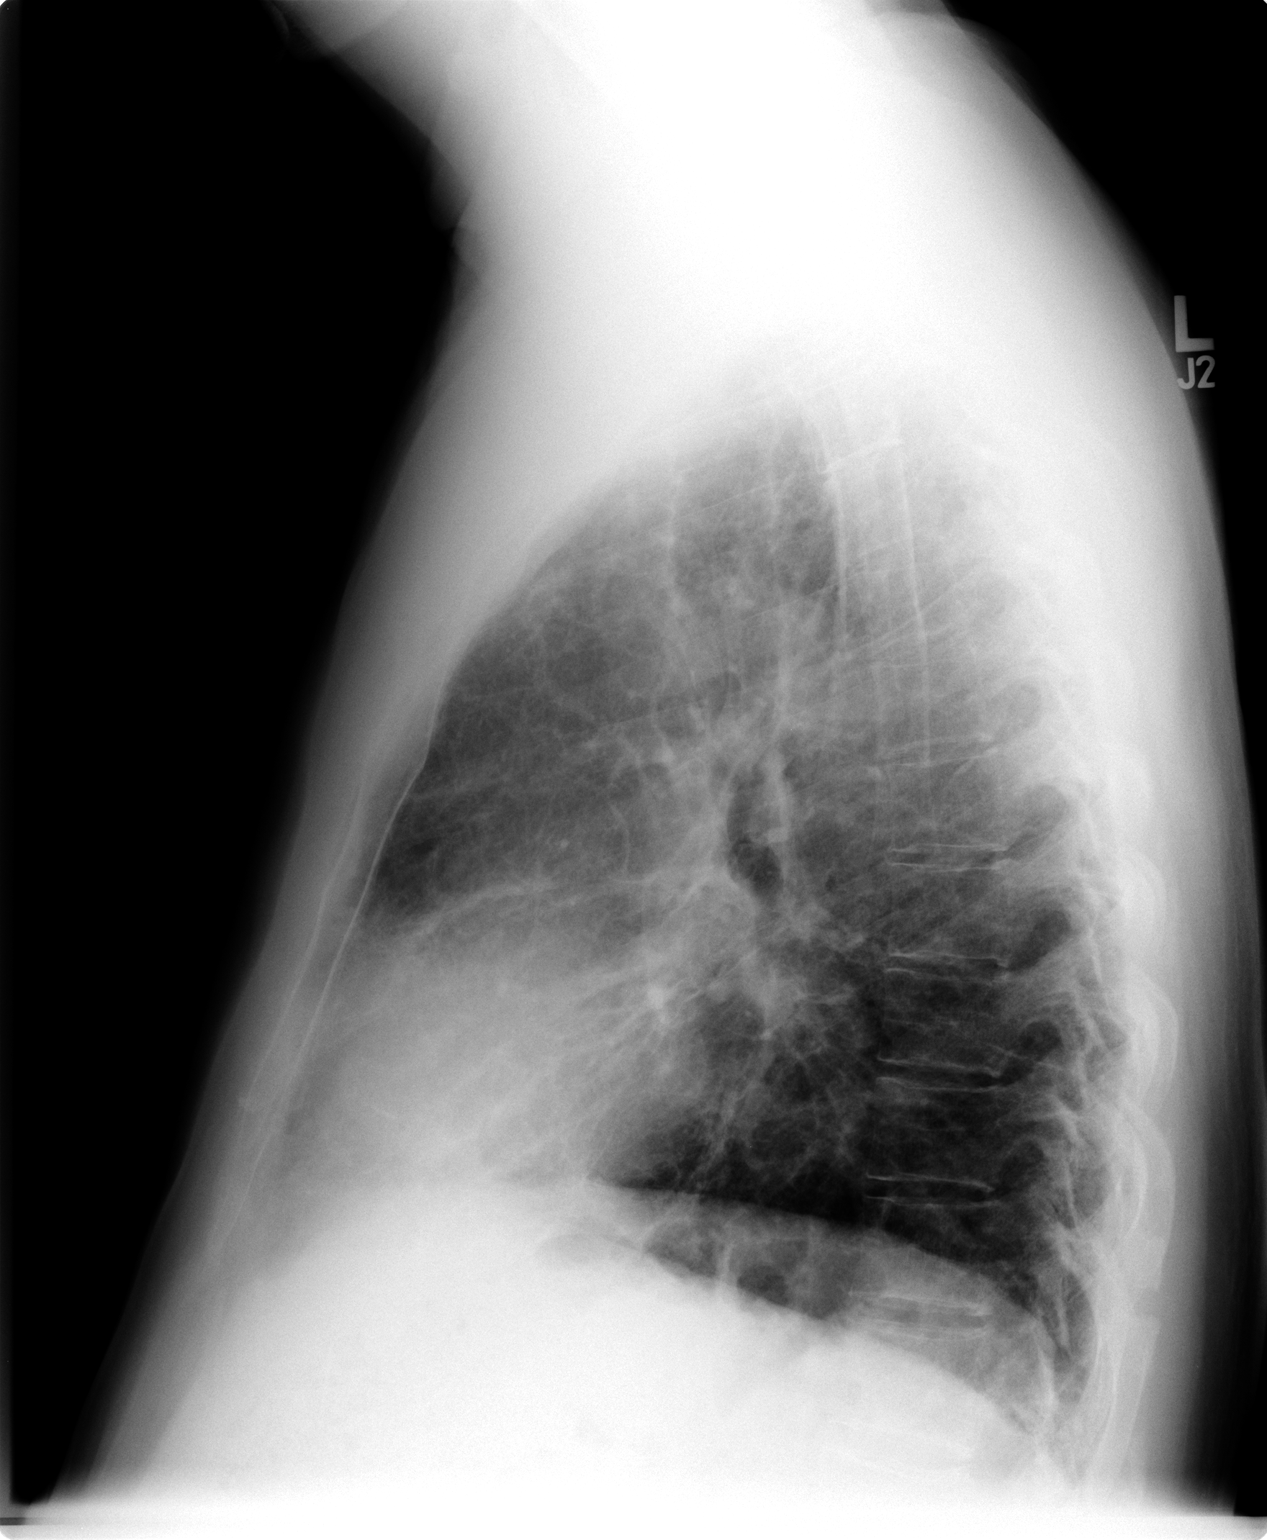

[2 of 2 positions shown; findings below may reference images not displayed]

FINDINGS: There is marked coarsening of the pulmonary interstitium,
worst in the lower lung zones bilaterally, greater on the left,
with changes extending into the right mid lung zone.  There is no
pneumothorax.  Mild blunting of the left costophrenic angle could
be due to a tiny effusion or scar.  Heart size is normal.
IMPRESSION: 1.  Appearance of the chest most compatible with pulmonary
fibrosis.  Coexistent pneumonia in the right lung base is possible.
2.  Tiny left pleural effusion versus pleural scar.
3.  Consider CT of the chest for further evaluation.

## 2013-12-01 IMAGING — CR DG CHEST 2V
2 series · 2 of 2 positions shown · non-contrast
Comparison: 01/21/2012

CLINICAL DATA: Cough

CHEST - 2 VIEW

[view not recorded (1 of 2)]
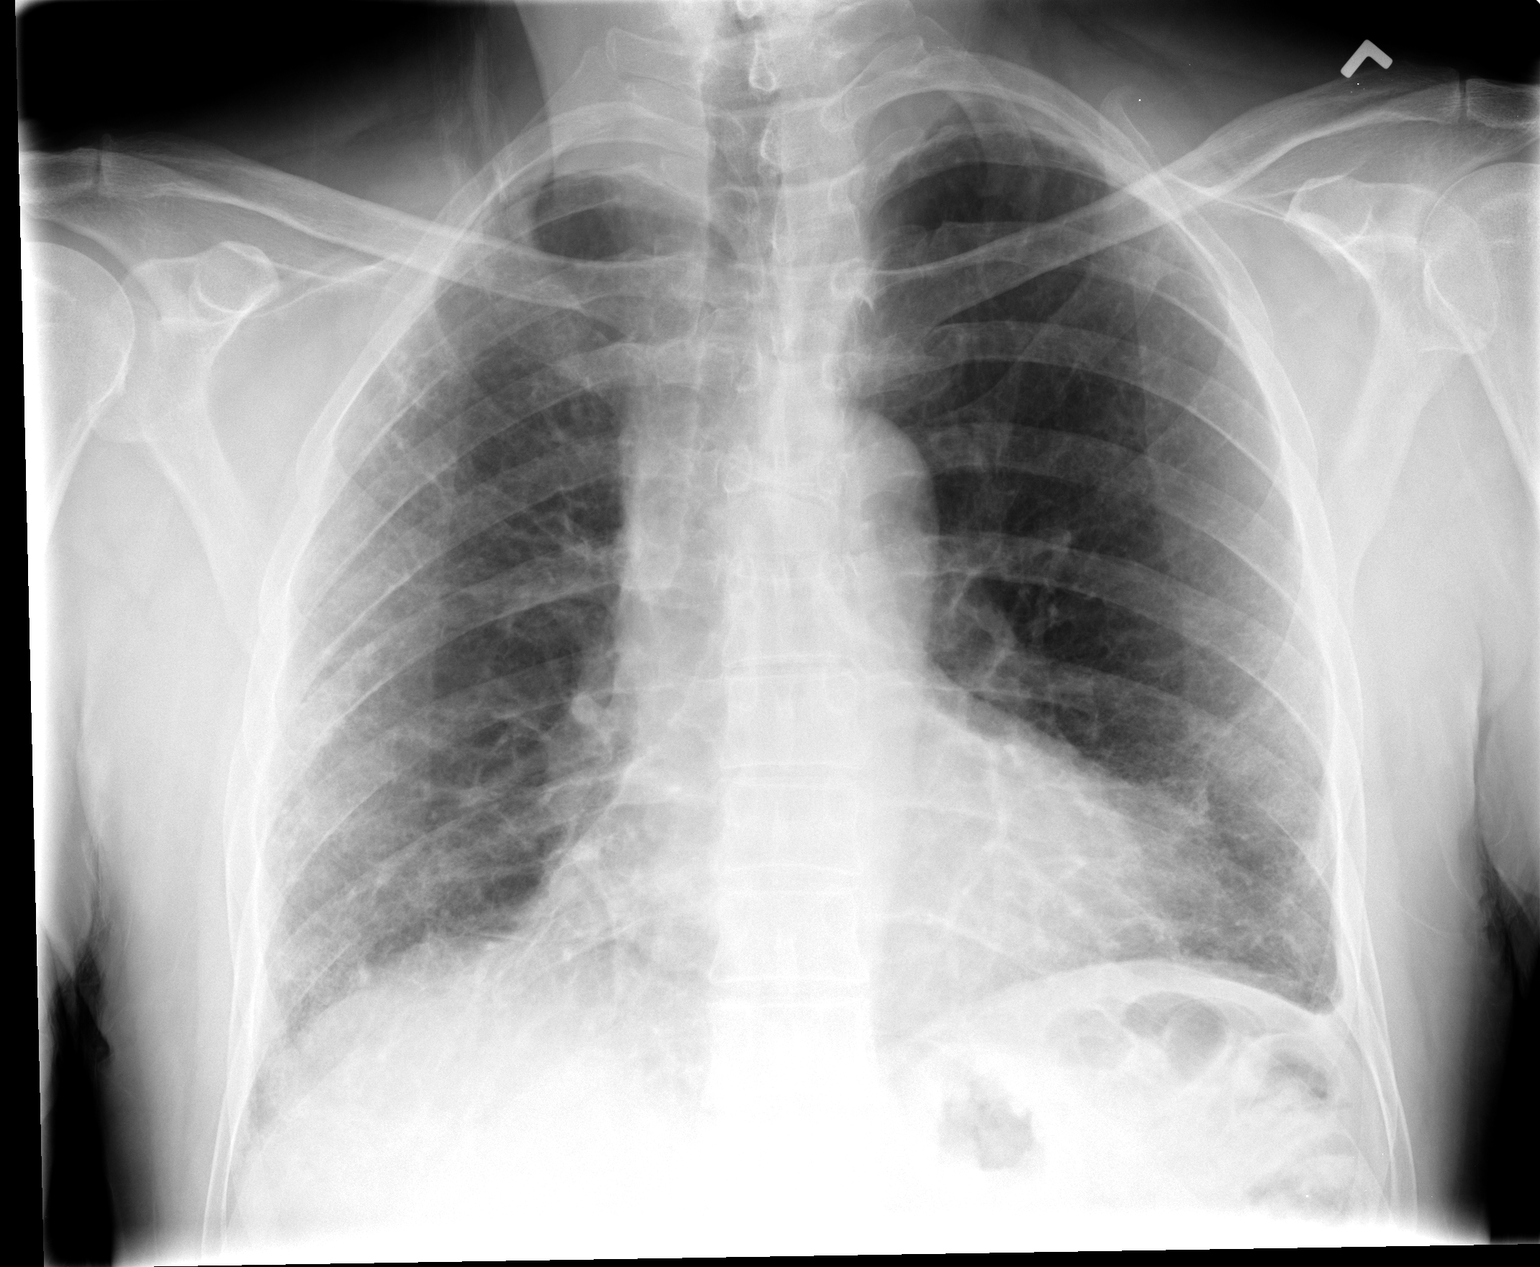

[view not recorded (2 of 2)]
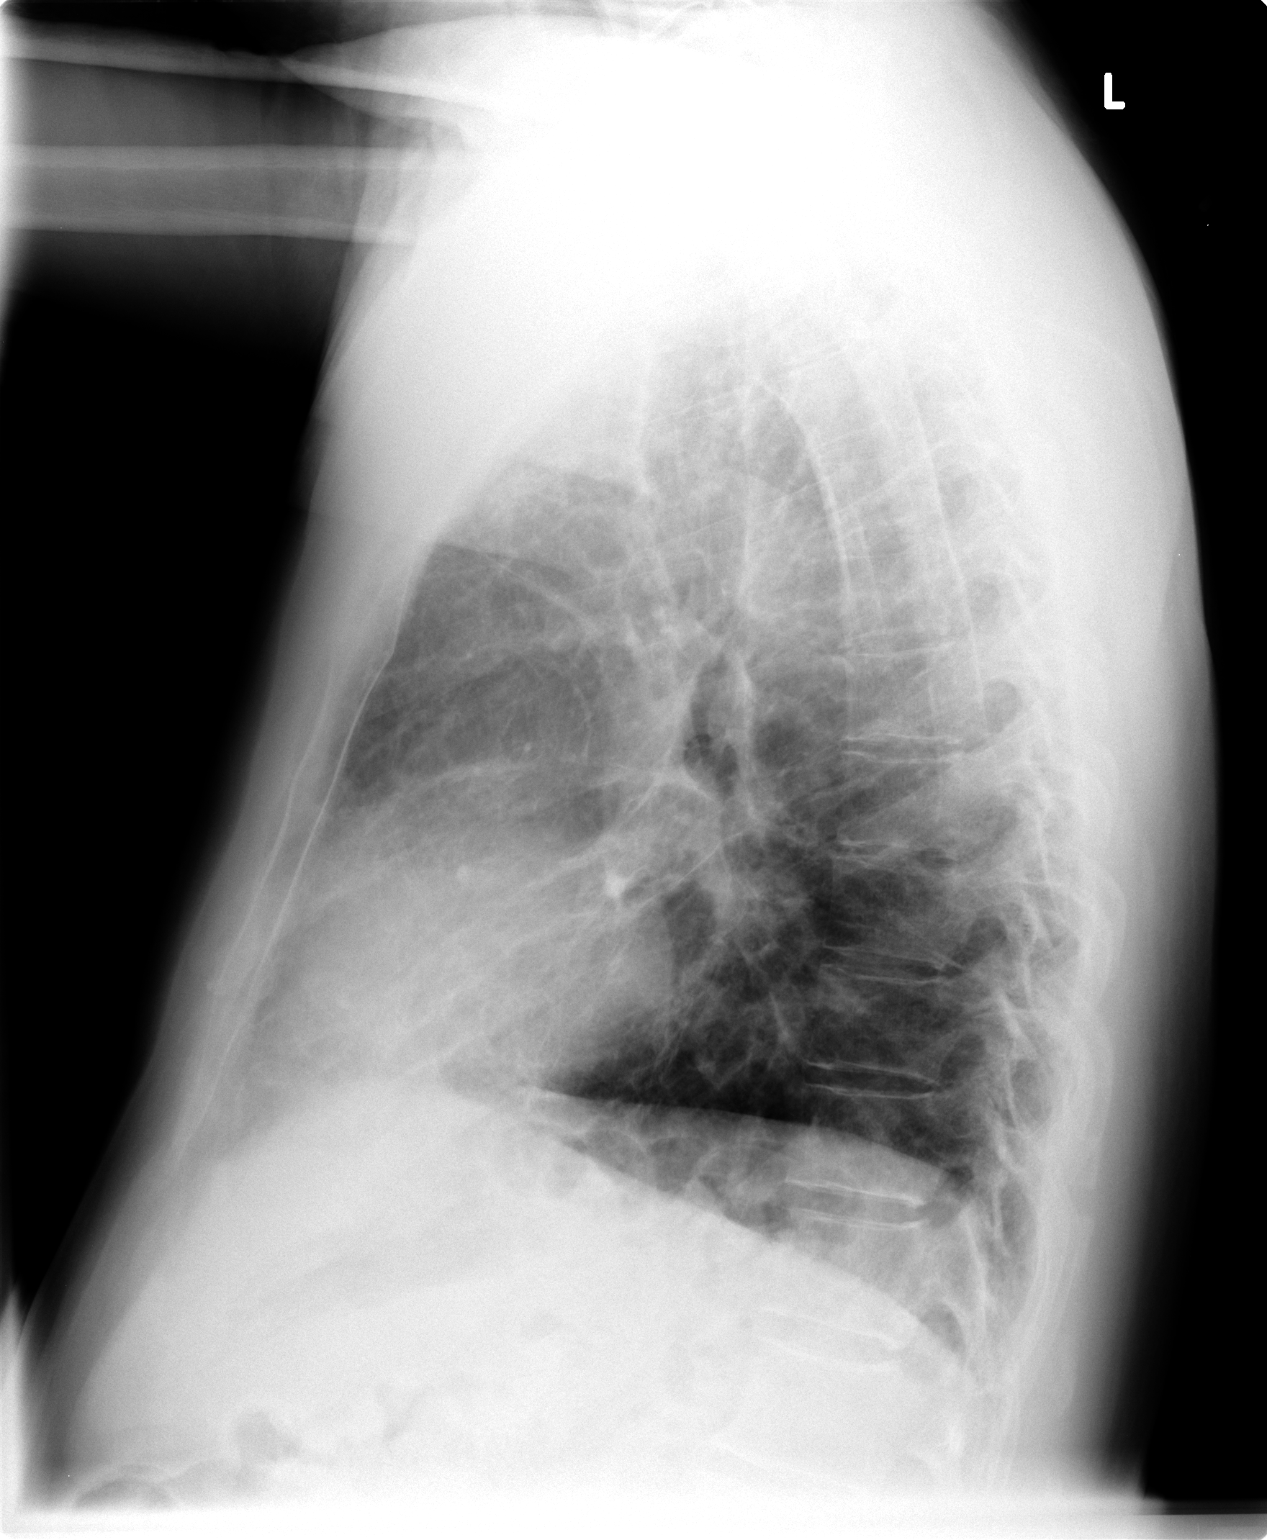

[2 of 2 positions shown; findings below may reference images not displayed]

FINDINGS: Coarse interstitial changes at the lung bases with a
peripheral distribution extending into the peripheral right upper
lobe are stable.  Low lung volumes.  Mild cardiomegaly.  Pleural
thickening at the left base. Mild pleural thickening at the right
apex.  No acute bony deformity.
IMPRESSION: Stable pulmonary fibrosis.

## 2022-06-02 ENCOUNTER — Other Ambulatory Visit: Payer: Self-pay

## 2022-06-02 ENCOUNTER — Inpatient Hospital Stay (HOSPITAL_COMMUNITY)
Admission: EM | Admit: 2022-06-02 | Discharge: 2022-06-13 | DRG: 871 | Disposition: A | Discharge status: Skilled Nursing Facility | Payer: Medicare HMO | Attending: Internal Medicine | Admitting: Internal Medicine

## 2022-06-02 ENCOUNTER — Emergency Department (HOSPITAL_COMMUNITY): Payer: Medicare HMO

## 2022-06-02 ENCOUNTER — Inpatient Hospital Stay (HOSPITAL_COMMUNITY): Payer: Medicare HMO

## 2022-06-02 ENCOUNTER — Encounter (HOSPITAL_COMMUNITY): Payer: Self-pay | Admitting: Emergency Medicine

## 2022-06-02 DIAGNOSIS — Z66 Do not resuscitate: Secondary | ICD-10-CM | POA: Diagnosis present

## 2022-06-02 DIAGNOSIS — E872 Acidosis, unspecified: Secondary | ICD-10-CM | POA: Diagnosis present

## 2022-06-02 DIAGNOSIS — E876 Hypokalemia: Secondary | ICD-10-CM | POA: Diagnosis present

## 2022-06-02 DIAGNOSIS — E43 Unspecified severe protein-calorie malnutrition: Secondary | ICD-10-CM | POA: Insufficient documentation

## 2022-06-02 DIAGNOSIS — Z9081 Acquired absence of spleen: Secondary | ICD-10-CM | POA: Diagnosis not present

## 2022-06-02 DIAGNOSIS — R911 Solitary pulmonary nodule: Secondary | ICD-10-CM | POA: Diagnosis present

## 2022-06-02 DIAGNOSIS — R6520 Severe sepsis without septic shock: Secondary | ICD-10-CM | POA: Diagnosis present

## 2022-06-02 DIAGNOSIS — J982 Interstitial emphysema: Secondary | ICD-10-CM | POA: Diagnosis present

## 2022-06-02 DIAGNOSIS — Z7189 Other specified counseling: Secondary | ICD-10-CM | POA: Diagnosis not present

## 2022-06-02 DIAGNOSIS — J189 Pneumonia, unspecified organism: Secondary | ICD-10-CM | POA: Diagnosis present

## 2022-06-02 DIAGNOSIS — E46 Unspecified protein-calorie malnutrition: Secondary | ICD-10-CM

## 2022-06-02 DIAGNOSIS — Z79899 Other long term (current) drug therapy: Secondary | ICD-10-CM | POA: Diagnosis not present

## 2022-06-02 DIAGNOSIS — E8809 Other disorders of plasma-protein metabolism, not elsewhere classified: Secondary | ICD-10-CM | POA: Diagnosis present

## 2022-06-02 DIAGNOSIS — R54 Age-related physical debility: Secondary | ICD-10-CM | POA: Diagnosis present

## 2022-06-02 DIAGNOSIS — Z681 Body mass index (BMI) 19 or less, adult: Secondary | ICD-10-CM

## 2022-06-02 DIAGNOSIS — J850 Gangrene and necrosis of lung: Secondary | ICD-10-CM

## 2022-06-02 DIAGNOSIS — R64 Cachexia: Secondary | ICD-10-CM | POA: Diagnosis present

## 2022-06-02 DIAGNOSIS — R634 Abnormal weight loss: Secondary | ICD-10-CM | POA: Diagnosis not present

## 2022-06-02 DIAGNOSIS — G9340 Encephalopathy, unspecified: Secondary | ICD-10-CM | POA: Diagnosis present

## 2022-06-02 DIAGNOSIS — R627 Adult failure to thrive: Secondary | ICD-10-CM

## 2022-06-02 DIAGNOSIS — U071 COVID-19: Secondary | ICD-10-CM | POA: Diagnosis present

## 2022-06-02 DIAGNOSIS — D638 Anemia in other chronic diseases classified elsewhere: Secondary | ICD-10-CM | POA: Diagnosis present

## 2022-06-02 DIAGNOSIS — Z515 Encounter for palliative care: Secondary | ICD-10-CM | POA: Diagnosis not present

## 2022-06-02 DIAGNOSIS — J939 Pneumothorax, unspecified: Secondary | ICD-10-CM | POA: Diagnosis present

## 2022-06-02 DIAGNOSIS — A4189 Other specified sepsis: Principal | ICD-10-CM | POA: Diagnosis present

## 2022-06-02 DIAGNOSIS — A419 Sepsis, unspecified organism: Principal | ICD-10-CM

## 2022-06-02 DIAGNOSIS — E860 Dehydration: Secondary | ICD-10-CM | POA: Diagnosis present

## 2022-06-02 LAB — CBC WITH DIFFERENTIAL/PLATELET
Abs Immature Granulocytes: 0.46 10*3/uL — ABNORMAL HIGH (ref 0.00–0.07)
Basophils Absolute: 0.1 10*3/uL (ref 0.0–0.1)
Basophils Relative: 0 %
Eosinophils Absolute: 0 10*3/uL (ref 0.0–0.5)
Eosinophils Relative: 0 %
HCT: 27.1 % — ABNORMAL LOW (ref 39.0–52.0)
Hemoglobin: 9.6 g/dL — ABNORMAL LOW (ref 13.0–17.0)
Immature Granulocytes: 2 %
Lymphocytes Relative: 3 %
Lymphs Abs: 0.6 10*3/uL — ABNORMAL LOW (ref 0.7–4.0)
MCH: 32.2 pg (ref 26.0–34.0)
MCHC: 35.4 g/dL (ref 30.0–36.0)
MCV: 90.9 fL (ref 80.0–100.0)
Monocytes Absolute: 1.3 10*3/uL — ABNORMAL HIGH (ref 0.1–1.0)
Monocytes Relative: 6 %
Neutro Abs: 19.7 10*3/uL — ABNORMAL HIGH (ref 1.7–7.7)
Neutrophils Relative %: 89 %
Platelets: 222 10*3/uL (ref 150–400)
RBC: 2.98 MIL/uL — ABNORMAL LOW (ref 4.22–5.81)
RDW: 15.5 % (ref 11.5–15.5)
WBC: 22.2 10*3/uL — ABNORMAL HIGH (ref 4.0–10.5)
nRBC: 0 % (ref 0.0–0.2)

## 2022-06-02 LAB — COMPREHENSIVE METABOLIC PANEL
ALT: 13 U/L (ref 0–44)
AST: 17 U/L (ref 15–41)
Albumin: 2.8 g/dL — ABNORMAL LOW (ref 3.5–5.0)
Alkaline Phosphatase: 81 U/L (ref 38–126)
Anion gap: 15 (ref 5–15)
BUN: 38 mg/dL — ABNORMAL HIGH (ref 8–23)
CO2: 32 mmol/L (ref 22–32)
Calcium: 9.2 mg/dL (ref 8.9–10.3)
Chloride: 88 mmol/L — ABNORMAL LOW (ref 98–111)
Creatinine, Ser: 0.92 mg/dL (ref 0.61–1.24)
GFR, Estimated: >60 mL/min (ref >60)
Glucose, Bld: 128 mg/dL — ABNORMAL HIGH (ref 70–99)
Potassium: 4 mmol/L (ref 3.5–5.1)
Sodium: 135 mmol/L (ref 135–145)
Total Bilirubin: 3.8 mg/dL — ABNORMAL HIGH (ref 0.3–1.2)
Total Protein: 8.8 g/dL — ABNORMAL HIGH (ref 6.5–8.1)

## 2022-06-02 LAB — PROTIME-INR
INR: 1.2 (ref 0.8–1.2)
Prothrombin Time: 15.5 seconds — ABNORMAL HIGH (ref 11.4–15.2)

## 2022-06-02 LAB — URINALYSIS, ROUTINE W REFLEX MICROSCOPIC
Glucose, UA: NEGATIVE mg/dL
Ketones, ur: 20 mg/dL — AB
Leukocytes,Ua: NEGATIVE
Nitrite: NEGATIVE
Protein, ur: 30 mg/dL — AB
Specific Gravity, Urine: 1.02 (ref 1.005–1.030)
pH: 5 (ref 5.0–8.0)

## 2022-06-02 LAB — APTT: aPTT: 40 seconds — ABNORMAL HIGH (ref 24–36)

## 2022-06-02 LAB — LACTIC ACID, PLASMA: Lactic Acid, Venous: 3.6 mmol/L (ref 0.5–1.9)

## 2022-06-02 MED ORDER — SODIUM CHLORIDE 0.9 % IV BOLUS
500.0000 mL | Freq: Once | INTRAVENOUS | Status: AC
  Administered 2022-06-02: 500 mL via INTRAVENOUS

## 2022-06-02 MED ORDER — LACTATED RINGERS IV SOLN: INTRAVENOUS | Status: DC

## 2022-06-02 MED ORDER — ENSURE ENLIVE PO LIQD
237.0000 mL | Freq: Two times a day (BID) | ORAL | Status: DC
  Administered 2022-06-03 – 2022-06-13 (×11): 237 mL via ORAL
  Filled 2022-06-02 (×6): qty 237

## 2022-06-02 MED ORDER — SODIUM CHLORIDE 0.9 % IV SOLN
500.0000 mg | INTRAVENOUS | Status: AC
  Administered 2022-06-02 – 2022-06-06 (×5): 500 mg via INTRAVENOUS
  Filled 2022-06-02 (×5): qty 5

## 2022-06-02 MED ORDER — DM-GUAIFENESIN ER 30-600 MG PO TB12
1.0000 | ORAL_TABLET | Freq: Two times a day (BID) | ORAL | Status: DC
  Administered 2022-06-03: 1 via ORAL
  Filled 2022-06-02 (×3): qty 1

## 2022-06-02 MED ORDER — IOHEXOL 300 MG/ML  SOLN
75.0000 mL | Freq: Once | INTRAMUSCULAR | Status: AC | PRN
  Administered 2022-06-02: 75 mL via INTRAVENOUS

## 2022-06-02 MED ORDER — SODIUM CHLORIDE 0.9 % IV SOLN
2.0000 g | INTRAVENOUS | Status: DC
  Administered 2022-06-02: 2 g via INTRAVENOUS
  Filled 2022-06-02: qty 20

## 2022-06-02 MED ORDER — HALOPERIDOL LACTATE 5 MG/ML IJ SOLN
5.0000 mg | Freq: Once | INTRAMUSCULAR | Status: AC | PRN
  Administered 2022-06-02: 5 mg via INTRAVENOUS
  Filled 2022-06-02: qty 1

## 2022-06-02 MED ORDER — SODIUM CHLORIDE 0.9 % IV BOLUS (SEPSIS)
1000.0000 mL | Freq: Once | INTRAVENOUS | Status: AC
  Administered 2022-06-02: 1000 mL via INTRAVENOUS

## 2022-06-02 NOTE — H&P (Incomplete)
History and Physical    Patient: Spencer Vargas:[id number] DOB: September 16, 1945 DOA: 06/02/2022 DOS: the patient was seen and examined on 06/02/2022 PCP: Lemmie Evens, MD  Patient coming from: Home  Chief Complaint:  Chief Complaint  Patient presents with  . Weakness   HPI: Spencer Vargas is a 76 y.o. cachectic male with no significant medical history presents to the emergency department after being brought in by a neighbor due to increased weakness.  Patient has not seen any physician in more than 15 years.  Patient was unable to provide history possibly due to his current acute illness states, history was obtained from ED physician and neighbor at bedside, per report, patient has productive cough of greenish and yellowish phlegm for more than 1 month and has been having an increasing shortness of breath on exertion over the last several weeks.  Patient was reported to be very active and was able to mow lawns as of early summer, however, he has had significant weight loss within the last few months.  Patient denies chest pain, fever, headache, nausea, vomiting.  ED Course:  In the emergency department, she was tachypneic and tachycardic.  Work-up in the ED showed leukocytosis and normocytic anemia.  BMP showed sodium 135, potassium 4.0, chloride 88, bicarb 32, glucose 128, BUN 38, creatinine 0.92, albumin 2.8, total bilirubin 3.8, lactic acid 3.6.  Urinalysis was unimpressive for UTI.  Blood culture pending. Chest x-ray showed: 1. Diffuse right lung airspace disease with dense consolidation in the right mid lung. Findings are worrisome for infection. Follow-up short-term chest x-ray recommended to confirm resolution. 2. Questionable nodular density in the left lung apex nonemergent chest CT recommended. He was started on IV ceftriaxone and azithromycin, IV hydration was provided and Haldol was given due to agitation.  Hospitalist was asked to admit patient for further evaluation and  management. CT chest with contrast showed: 1. Extensive pneumomediastinum with gas seen tracking into the neck base bilaterally and within the left apical subpleural space. 2. Small left pneumothorax without associated mediastinal shift or hyperexpansion to suggest tension physiology. 3. Extensive consolidation within the right lower lobe with central areas of cavitation in keeping with necrotizing pneumonia. Extensive debris within the dependent trachea and right mainstem bronchus without evidence of central obstructing lesion. 4. Extensive asymmetric architectural distortion within the right lung with extensive cystic change in keeping with advanced varicoid and cystic bronchiectasis. This may reflect the sequela of remote and/or recurrent infection. 5. Scattered areas of subpleural architectural distortion, traction bronchiolectasis, and inter and intra lobular septal thickening within the left lung in keeping with mild scattered areas of subpleural fibrosis. Findings may relate to underlying interstitial lung disease that this is not well assessed given the overlying acute inflammatory process. High-resolution CT examination of the chest may be helpful for further evaluation once the patient's acute issues have resolved.  Review of Systems: Review of systems as noted in the HPI. All other systems reviewed and are negative.   Past Medical History:  Diagnosis Date  . Medical history non-contributory    Past Surgical History:  Procedure Laterality Date  . CATARACT EXTRACTION W/PHACO Right 11/11/2012   Procedure: CATARACT EXTRACTION PHACO AND INTRAOCULAR LENS PLACEMENT (IOC);  Surgeon: Tonny Branch, MD;  Location: [facility];  Service: Ophthalmology;  Laterality: Right;  CDE: 53.68  . CATARACT EXTRACTION W/PHACO Left 11/29/2012   Procedure: CATARACT EXTRACTION PHACO AND INTRAOCULAR LENS PLACEMENT (IOC);  Surgeon: Tonny Branch, MD;  Location: [facility];  Service: Ophthalmology;  Laterality: Left;  CDE 58.63   . SPLENECTOMY, TOTAL  1970   [facility]    Social History:  reports that he has never smoked. He does not have any smokeless tobacco history on file. He reports that he does not drink alcohol and does not use drugs.   Allergies  Allergen Reactions  . Bee Venom     Family History  Adopted: Yes     Prior to Admission medications   Medication Sig Start Date End Date Taking? Authorizing Provider  Besifloxacin HCl (BESIVANCE) 0.6 % SUSP Apply 1 drop to eye 3 (three) times daily. Right eye    [provider]  Bromfenac Sodium (PROLENSA) 0.07 % SOLN Apply 1 drop to eye daily. Right eye    [provider]  cholecalciferol (VITAMIN D) 1000 UNITS tablet Take 1,000 Units by mouth daily.    [provider]  Difluprednate (DUREZOL) 0.05 % EMUL Apply 1 drop to eye 3 (three) times daily. Apply to right eye 3 times daily beginning after surgery    [provider]  Multiple Vitamin (MULTIVITAMIN WITH MINERALS) TABS Take 1 tablet by mouth daily.    [provider]  vitamin B-12 (CYANOCOBALAMIN) 1000 MCG tablet Take 1,000 mcg by mouth daily.    [provider]  vitamin C (ASCORBIC ACID) 500 MG tablet Take 500 mg by mouth daily.    [provider]    Physical Exam: BP 117/65   Pulse 83   Temp 98.1 F (36.7 C) (Axillary)   Resp 15   Ht 5' 9"  (1.753 m)   Wt 43.1 kg   SpO2 94%   BMI 14.03 kg/m   General: 76 y.o. year-old male cachectic, ill appearing, but in no acute distress.  Alert and oriented x3. HEENT: NCAT, EOMI, dry mucous membrane Neck: Supple, trachea medial Cardiovascular: Regular rate and rhythm with no rubs or gallops.  No thyromegaly or JVD noted.  No lower extremity edema. 2/4 pulses in all 4 extremities. Respiratory: Coarse breath sounds with diffuse rhonchi. Abdomen: Soft, nontender nondistended with normal bowel sounds x4 quadrants. Muskuloskeletal: No cyanosis, clubbing or edema noted  bilaterally Neuro: CN II-XII intact, sensation, reflexes intact Skin: No ulcerative lesions noted or rashes Psychiatry: Mood is appropriate for condition and setting          Labs on Admission:  Basic Metabolic Panel: Recent Labs  Lab 06/02/22 2025  NA 135  K 4.0  CL 88*  CO2 32  GLUCOSE 128*  BUN 38*  CREATININE 0.92  CALCIUM 9.2   Liver Function Tests: Recent Labs  Lab 06/02/22 2025  AST 17  ALT 13  ALKPHOS 81  BILITOT 3.8*  PROT 8.8*  ALBUMIN 2.8*   No results for input(s): "LIPASE", "AMYLASE" in the last 168 hours. No results for input(s): "AMMONIA" in the last 168 hours. CBC: Recent Labs  Lab 06/02/22 2025  WBC 22.2*  NEUTROABS 19.7*  HGB 9.6*  HCT 27.1*  MCV 90.9  PLT 222   Cardiac Enzymes: No results for input(s): "CKTOTAL", "CKMB", "CKMBINDEX", "TROPONINI" in the last 168 hours.  BNP (last 3 results) No results for input(s): "BNP" in the last 8760 hours.  ProBNP (last 3 results) No results for input(s): "PROBNP" in the last 8760 hours.  CBG: No results for input(s): "GLUCAP" in the last 168 hours.  Radiological Exams on Admission: DG Chest 2 View  Result Date: 06/02/2022 CLINICAL DATA:  Suspect sepsis EXAM: CHEST - 2 VIEW COMPARISON:  Chest  x-ray 04/09/2012 FINDINGS: There is diffuse right lung airspace disease with dense consolidation in the right mid lung. The cardiomediastinal silhouette is within normal limits for degree of patient rotation. There is no pleural effusion or pneumothorax. Questionable small nodular density in the left lung apex. No acute fractures are seen. IMPRESSION: 1. Diffuse right lung airspace disease with dense consolidation in the right mid lung. Findings are worrisome for infection. Follow-up short-term chest x-ray recommended to confirm resolution. 2. Questionable nodular density in the left lung apex nonemergent chest CT recommended. Electronically Signed   By: Ronney Asters M.D.   On: 06/02/2022 19:45    EKG: I  independently viewed the EKG done and my findings are as followed: Normal sinus rhythm at a rate of 93 bpm  Assessment/Plan Present on Admission: . CAP (community acquired pneumonia)  Principal Problem:   CAP (community acquired pneumonia)  Sepsis secondary to CAP POA Patient was tachypneic, tachycardic and presents with leukocytosis (met SIRS criteria) Chest x-ray was suggestive of pneumonia (met sepsis criteria) Patient was started on ceftriaxone and azithromycin, we shall continue same at this time with plan to de-escalate/discontinue based on blood culture, sputum culture, urine Legionella, strep pneumo and procalcitonin Continue Tylenol as needed Continue Mucinex, incentive spirometry, flutter valve   Lactic acidosis possibly secondary to above Lactic acid 3.6, continue to trend lactic acid  Dehydration Continue IV hydration  Hypoalbuminemia possibly secondary to moderate protein calorie malnutrition Failure to thrive in adult/deconditioning Weight loss (BMI 14.03) Albumin 2.8, protein supplement to be provided Dietitian will be consulted and we shall await further recommendations  Left lung apex questionable nodular density Chest x-ray was suggestive of an incidental finding of a nodule in the apex of left lung CT chest recommended was pending  DVT prophylaxis: Lovenox  Code Status: Full code  Consults: Dietitian  Family Communication: Friend at bedside (all questions answered to satisfaction) Severity of Illness: The appropriate patient status for this patient is INPATIENT. Inpatient status is judged to be reasonable and necessary in order to provide the required intensity of service to ensure the patient's safety. The patient's presenting symptoms, physical exam findings, and initial radiographic and laboratory data in the context of their chronic comorbidities is felt to place them at high risk for further clinical deterioration. Furthermore, it is not anticipated  that the patient will be medically stable for discharge from the hospital within 2 midnights of admission.   * I certify that at the point of admission it is my clinical judgment that the patient will require inpatient hospital care spanning beyond 2 midnights from the point of admission due to high intensity of service, high risk for further deterioration and high frequency of surveillance required.*  Author: Bernadette Hoit, DO 06/02/2022 10:12 PM  For on call review [email or web address].

## 2022-06-02 NOTE — H&P (Addendum)
History and Physical    Patient: Spencer Vargas PVX:[id number] DOB: 04/14/1946 DOA: 06/02/2022 DOS: the patient was seen and examined on 06/03/2022 PCP: Lemmie Evens, MD  Patient coming from: Home  Chief Complaint:  Chief Complaint  Patient presents with   Weakness   HPI: Spencer Vargas is a 76 y.o. cachectic male with no significant medical history presents to the emergency department after being brought in by a neighbor due to increased weakness.  Patient has not seen any physician in more than 15 years.  Patient was unable to provide history possibly due to his current acute illness states, history was obtained from ED physician and neighbor at bedside, per report, patient has productive cough of greenish and yellowish phlegm for more than 1 month and has been having an increasing shortness of breath on exertion over the last several weeks.  Patient was reported to be very active and was able to mow lawns as of early summer, however, he has had significant weight loss within the last few months.  Patient denies chest pain, fever, headache, nausea, vomiting.  ED Course:  In the emergency department, she was tachypneic and tachycardic.  Work-up in the ED showed leukocytosis and normocytic anemia.  BMP showed sodium 135, potassium 4.0, chloride 88, bicarb 32, glucose 128, BUN 38, creatinine 0.92, albumin 2.8, total bilirubin 3.8, lactic acid 3.6.  Urinalysis was unimpressive for UTI.  Blood culture pending. Chest x-ray showed: 1. Diffuse right lung airspace disease with dense consolidation in the right mid lung. Findings are worrisome for infection. Follow-up short-term chest x-ray recommended to confirm resolution. 2. Questionable nodular density in the left lung apex nonemergent chest CT recommended. He was started on IV ceftriaxone and azithromycin, IV hydration was provided and Haldol was given due to agitation.  Hospitalist was asked to admit patient for further evaluation and  management. CT chest with contrast showed: 1. Extensive pneumomediastinum with gas seen tracking into the neck base bilaterally and within the left apical subpleural space. 2. Small left pneumothorax without associated mediastinal shift or hyperexpansion to suggest tension physiology. 3. Extensive consolidation within the right lower lobe with central areas of cavitation in keeping with necrotizing pneumonia. Extensive debris within the dependent trachea and right mainstem bronchus without evidence of central obstructing lesion. 4. Extensive asymmetric architectural distortion within the right lung with extensive cystic change in keeping with advanced varicoid and cystic bronchiectasis. This may reflect the sequela of remote and/or recurrent infection. 5. Scattered areas of subpleural architectural distortion, traction bronchiolectasis, and inter and intra lobular septal thickening within the left lung in keeping with mild scattered areas of subpleural fibrosis. Findings may relate to underlying interstitial lung disease that this is not well assessed given the overlying acute inflammatory process. High-resolution CT examination of the chest may be helpful for further evaluation once the patient's acute issues have resolved.  Review of Systems: Review of systems as noted in the HPI. All other systems reviewed and are negative.   Past Medical History:  Diagnosis Date   Medical history non-contributory    Past Surgical History:  Procedure Laterality Date   CATARACT EXTRACTION W/PHACO Right 11/11/2012   Procedure: CATARACT EXTRACTION PHACO AND INTRAOCULAR LENS PLACEMENT (IOC);  Surgeon: Tonny Branch, MD;  Location: [facility];  Service: Ophthalmology;  Laterality: Right;  CDE: 53.68   CATARACT EXTRACTION W/PHACO Left 11/29/2012   Procedure: CATARACT EXTRACTION PHACO AND INTRAOCULAR LENS PLACEMENT (IOC);  Surgeon: Tonny Branch, MD;  Location: [facility];  Service: Ophthalmology;  Laterality: Left;  CDE 58.63    SPLENECTOMY, TOTAL  1970   [facility]    Social History:  reports that he has never smoked. He does not have any smokeless tobacco history on file. He reports that he does not drink alcohol and does not use drugs.   Allergies  Allergen Reactions   Bee Venom     Family History  Adopted: Yes     Prior to Admission medications   Medication Sig Start Date End Date Taking? Authorizing Provider  Besifloxacin HCl (BESIVANCE) 0.6 % SUSP Apply 1 drop to eye 3 (three) times daily. Right eye    [provider]  Bromfenac Sodium (PROLENSA) 0.07 % SOLN Apply 1 drop to eye daily. Right eye    [provider]  cholecalciferol (VITAMIN D) 1000 UNITS tablet Take 1,000 Units by mouth daily.    [provider]  Difluprednate (DUREZOL) 0.05 % EMUL Apply 1 drop to eye 3 (three) times daily. Apply to right eye 3 times daily beginning after surgery    [provider]  Multiple Vitamin (MULTIVITAMIN WITH MINERALS) TABS Take 1 tablet by mouth daily.    [provider]  vitamin B-12 (CYANOCOBALAMIN) 1000 MCG tablet Take 1,000 mcg by mouth daily.    [provider]  vitamin C (ASCORBIC ACID) 500 MG tablet Take 500 mg by mouth daily.    [provider]    Physical Exam: BP (!) 145/66   Pulse (!) 104   Temp 98.1 F (36.7 C) (Axillary)   Resp (!) 22   Ht 5' 9"  (1.753 m)   Wt 43.1 kg   SpO2 95%   BMI 14.03 kg/m   General: 76 y.o. year-old male cachectic, ill appearing, but in no acute distress.  Alert and oriented x3. HEENT: NCAT, EOMI, dry mucous membrane Neck: Supple, trachea medial Cardiovascular: Regular rate and rhythm with no rubs or gallops.  No thyromegaly or JVD noted.  No lower extremity edema. 2/4 pulses in all 4 extremities. Respiratory: Coarse breath sounds with diffuse rhonchi. Abdomen: Soft, nontender nondistended with normal bowel sounds x4 quadrants. Muskuloskeletal: No cyanosis, clubbing or edema noted  bilaterally Neuro: CN II-XII intact, sensation, reflexes intact Skin: No ulcerative lesions noted or rashes Psychiatry: Mood is appropriate for condition and setting          Labs on Admission:  Basic Metabolic Panel: Recent Labs  Lab 06/02/22 2025  NA 135  K 4.0  CL 88*  CO2 32  GLUCOSE 128*  BUN 38*  CREATININE 0.92  CALCIUM 9.2   Liver Function Tests: Recent Labs  Lab 06/02/22 2025  AST 17  ALT 13  ALKPHOS 81  BILITOT 3.8*  PROT 8.8*  ALBUMIN 2.8*   No results for input(s): "LIPASE", "AMYLASE" in the last 168 hours. No results for input(s): "AMMONIA" in the last 168 hours. CBC: Recent Labs  Lab 06/02/22 2025  WBC 22.2*  NEUTROABS 19.7*  HGB 9.6*  HCT 27.1*  MCV 90.9  PLT 222   Cardiac Enzymes: No results for input(s): "CKTOTAL", "CKMB", "CKMBINDEX", "TROPONINI" in the last 168 hours.  BNP (last 3 results) No results for input(s): "BNP" in the last 8760 hours.  ProBNP (last 3 results) No results for input(s): "PROBNP" in the last 8760 hours.  CBG: No results for input(s): "GLUCAP" in the last 168 hours.  Radiological Exams on Admission: CT Chest W Contrast  Result Date: 06/02/2022 CLINICAL DATA:  Pneumonia, complication suspected. Abnormal chest x-ray. EXAM:  CT CHEST WITH CONTRAST TECHNIQUE: Multidetector CT imaging of the chest was performed during intravenous contrast administration. RADIATION DOSE REDUCTION: This exam was performed according to the departmental dose-optimization program which includes automated exposure control, adjustment of the mA and/or kV according to patient size and/or use of iterative reconstruction technique. CONTRAST:  52m OMNIPAQUE IOHEXOL 300 MG/ML  SOLN COMPARISON:  None Available. FINDINGS: Cardiovascular: Cardiac size within normal limits. No significant coronary artery calcification. No pericardial effusion. Central pulmonary arteries are of normal caliber. The thoracic aorta is unremarkable. Mediastinum/Nodes: There is  extensive pneumomediastinum with gas seen tracking into the neck base bilaterally and within the left apical subpleural space. The visualized thyroid is unremarkable. No pathologic thoracic adenopathy. The esophagus is unremarkable. There is extensive layering debris within the distal trachea and within the right mainstem bronchus. Lungs/Pleura: There is asymmetric right-sided volume loss. There is extensive asymmetric architectural distortion within the right lung demonstrating subpleural and basilar predominance. There is extensive cystic change within these regions which are in keeping with advanced varicoid and cystic bronchiectasis, best appreciated on coronal imaging. This may reflect the sequela of remote or recurrent infection. There is superimposed extensive consolidation within the a right lower lobe with central areas of cavitation in keeping with superimposed necrotizing pneumonia. Scattered infiltrate is also seen within the right upper lobe. Within the left lung, there is mild, scattered areas of subpleural architectural distortion, traction bronchiolectasis, and inter and intra lobular septal thickening in keeping with mild scattered areas of subpleural fibrosis. Findings may relate to underlying interstitial lung disease that this is not well assessed given the overlying acute inflammatory process. Small left pneumothorax is present without associated mediastinal shift or hyperexpansion to suggest tension physiology. No pleural effusion. Upper Abdomen: No acute abnormality. Musculoskeletal: No acute bone abnormality. No lytic or blastic bone lesion. IMPRESSION: 1. Extensive pneumomediastinum with gas seen tracking into the neck base bilaterally and within the left apical subpleural space. 2. Small left pneumothorax without associated mediastinal shift or hyperexpansion to suggest tension physiology. 3. Extensive consolidation within the right lower lobe with central areas of cavitation in keeping  with necrotizing pneumonia. Extensive debris within the dependent trachea and right mainstem bronchus without evidence of central obstructing lesion. 4. Extensive asymmetric architectural distortion within the right lung with extensive cystic change in keeping with advanced varicoid and cystic bronchiectasis. This may reflect the sequela of remote and/or recurrent infection. 5. Scattered areas of subpleural architectural distortion, traction bronchiolectasis, and inter and intra lobular septal thickening within the left lung in keeping with mild scattered areas of subpleural fibrosis. Findings may relate to underlying interstitial lung disease that this is not well assessed given the overlying acute inflammatory process. High-resolution CT examination of the chest may be helpful for further evaluation once the patient's acute issues have resolved. Electronically Signed   By: AFidela SalisburyM.D.   On: 06/02/2022 23:22   DG Chest 2 View  Result Date: 06/02/2022 CLINICAL DATA:  Suspect sepsis EXAM: CHEST - 2 VIEW COMPARISON:  Chest x-ray 04/09/2012 FINDINGS: There is diffuse right lung airspace disease with dense consolidation in the right mid lung. The cardiomediastinal silhouette is within normal limits for degree of patient rotation. There is no pleural effusion or pneumothorax. Questionable small nodular density in the left lung apex. No acute fractures are seen. IMPRESSION: 1. Diffuse right lung airspace disease with dense consolidation in the right mid lung. Findings are worrisome for infection. Follow-up short-term chest x-ray recommended to confirm resolution. 2. Questionable nodular  density in the left lung apex nonemergent chest CT recommended. Electronically Signed   By: Ronney Asters M.D.   On: 06/02/2022 19:45    EKG: I independently viewed the EKG done and my findings are as followed: Normal sinus rhythm at a rate of 93 bpm  Assessment/Plan Present on Admission: **None**  Principal Problem:    Necrotizing pneumonia (HCC) Active Problems:   Lactic acidosis   Failure to thrive in adult   Dehydration   Weight loss   Hypoalbuminemia due to protein-calorie malnutrition (HCC)   Pulmonary nodule   Pneumothorax  Sepsis secondary to necrotizing pneumonia Patient was tachypneic, tachycardic and presents with leukocytosis (met SIRS criteria) Chest x-ray was suggestive of pneumonia (met sepsis criteria) Patient was started on vancomycin, Zosyn and azithromycin, we shall continue same at this time with plan to de-escalate/discontinue based on blood culture, sputum culture, urine Legionella, strep pneumo and procalcitonin Continue Tylenol as needed Continue Mucinex, incentive spirometry, flutter valve   Lactic acidosis possibly secondary to above Lactic acid 3.6, continue to trend lactic acid  Pneumothorax and pneumomediastinum CT chest with contrast showed small left pneumothorax without associated mediastinal shift or hyperexpansion to suggest tension physiology. Pulmonology will be consulted in the morning and we shall await further recommendation  Possible underlying interstitial lung disease Pulmonology will be consulted and pneumonia shall await further recommendation  Dehydration Continue IV hydration  Hypoalbuminemia possibly secondary to moderate protein calorie malnutrition Failure to thrive in adult/deconditioning Weight loss (BMI 14.03) Albumin 2.8, protein supplement to be provided Dietitian will be consulted and we shall await further recommendations   DVT prophylaxis: Lovenox  Code Status: Full code  Consults: Dietitian  Family Communication: Friend at bedside (all questions answered to satisfaction) Severity of Illness: The appropriate patient status for this patient is INPATIENT. Inpatient status is judged to be reasonable and necessary in order to provide the required intensity of service to ensure the patient's safety. The patient's presenting symptoms,  physical exam findings, and initial radiographic and laboratory data in the context of their chronic comorbidities is felt to place them at high risk for further clinical deterioration. Furthermore, it is not anticipated that the patient will be medically stable for discharge from the hospital within 2 midnights of admission.   * I certify that at the point of admission it is my clinical judgment that the patient will require inpatient hospital care spanning beyond 2 midnights from the point of admission due to high intensity of service, high risk for further deterioration and high frequency of surveillance required.*  Author: Bernadette Hoit, DO 06/03/2022 1:42 AM  For on call review [email or web address].

## 2022-06-02 NOTE — ED Triage Notes (Signed)
Pt brought in by a neighbor for increased weakness. Pt states he is too weak to answer our questions. Neighbor states pt has "been sick for 3 weeks" and he thinks he "has pneumonia". Neighbor states pt has not seen an MD in "probably 37yrs".

## 2022-06-02 NOTE — ED Notes (Signed)
Dr Adefeso at bedside. 

## 2022-06-02 NOTE — ED Provider Notes (Addendum)
[facility] Provider Note   CSN: [id number] Arrival date & time: 06/02/22  [id number]     History  Chief Complaint  Patient presents with  . Weakness    Spencer Vargas is a 76 y.o. male.  HPI     76 year old male comes in with chief complaint of weakness. Patient has not seen a physician in over 15 years. Per charts, he has history of splenectomy.  He does not take any medications.  The neighbors brought the patient to the ER today they alleged that patient has had increasing weakness over the last 3 weeks.  Patient indicates that he has been coughing over the last month.  He is having shortness of breath with exertion.  The shortness of breath with exertion actually has worsened over the last several weeks.  Earlier this summer, he was actually mowing lawns.  He has also had significant weight loss, but unsure how much.  Patient's cough is producing green and yellow phlegm.  He denies any blood in his stools, blood in his cough.  He denies any heavy smoking.  Home Medications Prior to Admission medications   Medication Sig Start Date End Date Taking? Authorizing Provider  Besifloxacin HCl (BESIVANCE) 0.6 % SUSP Apply 1 drop to eye 3 (three) times daily. Right eye    [provider]  Bromfenac Sodium (PROLENSA) 0.07 % SOLN Apply 1 drop to eye daily. Right eye    [provider]  cholecalciferol (VITAMIN D) 1000 UNITS tablet Take 1,000 Units by mouth daily.    [provider]  Difluprednate (DUREZOL) 0.05 % EMUL Apply 1 drop to eye 3 (three) times daily. Apply to right eye 3 times daily beginning after surgery    [provider]  Multiple Vitamin (MULTIVITAMIN WITH MINERALS) TABS Take 1 tablet by mouth daily.    [provider]  vitamin B-12 (CYANOCOBALAMIN) 1000 MCG tablet Take 1,000 mcg by mouth daily.    [provider]  vitamin C (ASCORBIC ACID) 500 MG tablet Take 500 mg by mouth daily.    [provider]      Allergies    Bee venom    Review of Systems   Review of Systems  All other systems reviewed and are negative.   Physical Exam Updated Vital Signs BP (!) 143/61   Pulse (!) 103   Temp 98.1 F (36.7 C) (Axillary)   Resp (!) 25   Ht 5\' 9"  (1.753 m)   Wt 43.1 kg   SpO2 93%   BMI 14.03 kg/m  Physical Exam Vitals and nursing note reviewed.  Constitutional:      Appearance: He is well-developed. He is ill-appearing.     Comments: Cachectic male  HENT:     Head: Atraumatic.  Cardiovascular:     Rate and Rhythm: Normal rate.  Pulmonary:     Breath sounds: Rhonchi present.     Comments: Right-sided rhonchorous breath sounds Musculoskeletal:     Cervical back: Neck supple.  Skin:    General: Skin is warm.  Neurological:     Mental Status: He is alert and oriented to person, place, and time.     ED Results / Procedures / Treatments   Labs (all labs ordered are listed, but only abnormal results are displayed) Labs Reviewed  COMPREHENSIVE METABOLIC PANEL - Abnormal; Notable for the following components:      Result Value   Chloride 88 (*)    Glucose, Bld 128 (*)  BUN 38 (*)    Total Protein 8.8 (*)    Albumin 2.8 (*)    Total Bilirubin 3.8 (*)    All other components within normal limits  LACTIC ACID, PLASMA - Abnormal; Notable for the following components:   Lactic Acid, Venous 3.6 (*)    All other components within normal limits  CBC WITH DIFFERENTIAL/PLATELET - Abnormal; Notable for the following components:   WBC 22.2 (*)    RBC 2.98 (*)    Hemoglobin 9.6 (*)    HCT 27.1 (*)    Neutro Abs 19.7 (*)    Lymphs Abs 0.6 (*)    Monocytes Absolute 1.3 (*)    Abs Immature Granulocytes 0.46 (*)    All other components within normal limits  PROTIME-INR - Abnormal; Notable for the following components:   Prothrombin Time 15.5 (*)    All other components within normal limits  URINALYSIS, ROUTINE W REFLEX MICROSCOPIC - Abnormal; Notable for the  following components:   Color, Urine AMBER (*)    Hgb urine dipstick SMALL (*)    Bilirubin Urine SMALL (*)    Ketones, ur 20 (*)    Protein, ur 30 (*)    Bacteria, UA RARE (*)    All other components within normal limits  APTT - Abnormal; Notable for the following components:   aPTT 40 (*)    All other components within normal limits  CULTURE, BLOOD (ROUTINE X 2)  CULTURE, BLOOD (ROUTINE X 2)  EXPECTORATED SPUTUM ASSESSMENT W GRAM STAIN, RFLX TO RESP C  LACTIC ACID, PLASMA  STREP PNEUMONIAE URINARY ANTIGEN  LEGIONELLA PNEUMOPHILA SEROGP 1 UR AG  PROCALCITONIN    EKG EKG Interpretation  Date/Time:  Monday June 02 2022 20:32:47 EDT Ventricular Rate:  93 PR Interval:  150 QRS Duration: 89 QT Interval:  361 QTC Calculation: 449 R Axis:   42 Text Interpretation: Sinus rhythm Abnormal R-wave progression, early transition No acute changes No significant change since last tracing Confirmed by Varney Biles [id number]) on 06/02/2022 9:56:43 PM  Radiology CT Chest W Contrast  Result Date: 06/02/2022 CLINICAL DATA:  Pneumonia, complication suspected. Abnormal chest x-ray. EXAM: CT CHEST WITH CONTRAST TECHNIQUE: Multidetector CT imaging of the chest was performed during intravenous contrast administration. RADIATION DOSE REDUCTION: This exam was performed according to the departmental dose-optimization program which includes automated exposure control, adjustment of the mA and/or kV according to patient size and/or use of iterative reconstruction technique. CONTRAST:  32mL OMNIPAQUE IOHEXOL 300 MG/ML  SOLN COMPARISON:  None Available. FINDINGS: Cardiovascular: Cardiac size within normal limits. No significant coronary artery calcification. No pericardial effusion. Central pulmonary arteries are of normal caliber. The thoracic aorta is unremarkable. Mediastinum/Nodes: There is extensive pneumomediastinum with gas seen tracking into the neck base bilaterally and within the left apical  subpleural space. The visualized thyroid is unremarkable. No pathologic thoracic adenopathy. The esophagus is unremarkable. There is extensive layering debris within the distal trachea and within the right mainstem bronchus. Lungs/Pleura: There is asymmetric right-sided volume loss. There is extensive asymmetric architectural distortion within the right lung demonstrating subpleural and basilar predominance. There is extensive cystic change within these regions which are in keeping with advanced varicoid and cystic bronchiectasis, best appreciated on coronal imaging. This may reflect the sequela of remote or recurrent infection. There is superimposed extensive consolidation within the a right lower lobe with central areas of cavitation in keeping with superimposed necrotizing pneumonia. Scattered infiltrate is also seen within the right upper lobe. Within the left  lung, there is mild, scattered areas of subpleural architectural distortion, traction bronchiolectasis, and inter and intra lobular septal thickening in keeping with mild scattered areas of subpleural fibrosis. Findings may relate to underlying interstitial lung disease that this is not well assessed given the overlying acute inflammatory process. Small left pneumothorax is present without associated mediastinal shift or hyperexpansion to suggest tension physiology. No pleural effusion. Upper Abdomen: No acute abnormality. Musculoskeletal: No acute bone abnormality. No lytic or blastic bone lesion. IMPRESSION: 1. Extensive pneumomediastinum with gas seen tracking into the neck base bilaterally and within the left apical subpleural space. 2. Small left pneumothorax without associated mediastinal shift or hyperexpansion to suggest tension physiology. 3. Extensive consolidation within the right lower lobe with central areas of cavitation in keeping with necrotizing pneumonia. Extensive debris within the dependent trachea and right mainstem bronchus without  evidence of central obstructing lesion. 4. Extensive asymmetric architectural distortion within the right lung with extensive cystic change in keeping with advanced varicoid and cystic bronchiectasis. This may reflect the sequela of remote and/or recurrent infection. 5. Scattered areas of subpleural architectural distortion, traction bronchiolectasis, and inter and intra lobular septal thickening within the left lung in keeping with mild scattered areas of subpleural fibrosis. Findings may relate to underlying interstitial lung disease that this is not well assessed given the overlying acute inflammatory process. High-resolution CT examination of the chest may be helpful for further evaluation once the patient's acute issues have resolved. Electronically Signed   By: Fidela Salisbury M.D.   On: 06/02/2022 23:22   DG Chest 2 View  Result Date: 06/02/2022 CLINICAL DATA:  Suspect sepsis EXAM: CHEST - 2 VIEW COMPARISON:  Chest x-ray 04/09/2012 FINDINGS: There is diffuse right lung airspace disease with dense consolidation in the right mid lung. The cardiomediastinal silhouette is within normal limits for degree of patient rotation. There is no pleural effusion or pneumothorax. Questionable small nodular density in the left lung apex. No acute fractures are seen. IMPRESSION: 1. Diffuse right lung airspace disease with dense consolidation in the right mid lung. Findings are worrisome for infection. Follow-up short-term chest x-ray recommended to confirm resolution. 2. Questionable nodular density in the left lung apex nonemergent chest CT recommended. Electronically Signed   By: Ronney Asters M.D.   On: 06/02/2022 19:45    Procedures .Critical Care  Performed by: Varney Biles, MD Authorized by: Varney Biles, MD   Critical care provider statement:    Critical care time (minutes):  32   Critical care was necessary to treat or prevent imminent or life-threatening deterioration of the following conditions:   Sepsis and CNS failure or compromise   Critical care was time spent personally by me on the following activities:  Development of treatment plan with patient or surrogate, discussions with consultants, evaluation of patient's response to treatment, examination of patient, ordering and review of laboratory studies, ordering and review of radiographic studies, ordering and performing treatments and interventions, pulse oximetry, re-evaluation of patient's condition, review of old charts and obtaining history from patient or surrogate     Medications Ordered in ED Medications  lactated ringers infusion ( Intravenous New Bag/Given 06/02/22 2216)  cefTRIAXone (ROCEPHIN) 2 g in sodium chloride 0.9 % 100 mL IVPB (2 g Intravenous New Bag/Given 06/02/22 2214)  azithromycin (ZITHROMAX) 500 mg in sodium chloride 0.9 % 250 mL IVPB (500 mg Intravenous New Bag/Given 06/02/22 2214)  dextromethorphan-guaiFENesin (MUCINEX DM) 30-600 MG per 12 hr tablet 1 tablet (has no administration in time range)  feeding  supplement (ENSURE ENLIVE / ENSURE PLUS) liquid 237 mL (has no administration in time range)  sodium chloride 0.9 % bolus 1,000 mL (1,000 mLs Intravenous New Bag/Given 06/02/22 2209)  sodium chloride 0.9 % bolus 500 mL (500 mLs Intravenous New Bag/Given 06/02/22 2210)  haloperidol lactate (HALDOL) injection 5 mg (5 mg Intravenous Given 06/02/22 2215)  iohexol (OMNIPAQUE) 300 MG/ML solution 75 mL (75 mLs Intravenous Contrast Given 06/02/22 2237)    ED Course/ Medical Decision Making/ A&P Clinical Course as of 06/02/22 2338  Mon Jun 02, 2022  2338 Patient has a necrotizing pneumonia with pneumomediastinum.  This finding was discussed with the admitting team Dr. Josephine Cables.  I will have to give patient vancomycin and Zosyn.  Admitting team will consult pulmonary team in the morning and they can de-escalate as appropriate.  Patient also has a small pneumothorax.  We will put him on nonrebreather. [AN]    Clinical  Course User Index [AN] Varney Biles, MD                           Medical Decision Making Amount and/or Complexity of Data Reviewed Labs: ordered. Radiology: ordered. ECG/medicine tests: ordered.  Risk Prescription drug management. Decision regarding hospitalization.   This patient presents to the ED with chief complaint(s) of worsening weakness over the last several weeks, cough, shortness of breath with exertion and weight loss with pertinent past medical history of no medical evaluation over several years.it seems like symptoms have been subacute in nature.  Patient was mowing his lawn earlier in the summer, now having shortness of breath with exertion, and unable to complete ADLs.patient has no family, neighbors brought him to the ER.  Allegedly patient is also hallucinating.  The complaint involves an extensive differential diagnosis and also carries with it a high risk of complications and morbidity.    The differential diagnosis includes community-acquired pneumonia, questionable sepsis, lung cancer, paraneoplastic process, severe anemia, severe electrolyte abnormality, renal failure, severe dehydration  The initial plan is to get basic labs, chest x-ray.   Additional history obtained: Additional history obtained from  patient's neighbors   @20 :59: Reviewed patient's x-ray.  Right-sided consolidation noted. Wondering if the underlying cause is pneumonia versus there is cancer.  Patient has been having cough now for several weeks and it appears that this is a slow decline.  He also appears cachectic. We will make the decision based on other lab assessments and reassessment.  Independent labs interpretation:  @9 :50 pm: The following labs were independently interpreted: Patient's white count is elevated, lactic acid is also elevated.  No significant renal injury, but patient has uremia. We will start antibiotics now.  Clinical suspicion is high for multifocal  pneumonia. Admitting team can order CT scan if patient is not responding.  Independent visualization and interpretation of imaging: - I independently visualized the following imaging with scope of interpretation limited to determining acute life threatening conditions related to emergency care: X-ray of the chest, which revealed right upper lobe consolidation  Treatment and Reassessment: Patient reassessed at 9:50 PM.  Discussed with him the plan to start antibiotics and admit him to the hospital. 1.5 L of IV fluid ordered. Treatment initiated.  Code sepsis called.  Final Clinical Impression(s) / ED Diagnoses Final diagnoses:  Severe sepsis ([place])  Community acquired pneumonia of right upper lobe of lung  Acute encephalopathy    Rx / DC Orders ED Discharge Orders     None  Varney Biles, MD 06/02/22 2156    Varney Biles, MD 06/02/22 0802    Varney Biles, MD 06/02/22 [id number]

## 2022-06-02 NOTE — Sepsis Progress Note (Signed)
[facility] following for Sepsis Protocol 

## 2022-06-03 ENCOUNTER — Encounter (HOSPITAL_COMMUNITY): Payer: Self-pay | Admitting: Internal Medicine

## 2022-06-03 DIAGNOSIS — R627 Adult failure to thrive: Secondary | ICD-10-CM | POA: Diagnosis not present

## 2022-06-03 DIAGNOSIS — Z515 Encounter for palliative care: Secondary | ICD-10-CM

## 2022-06-03 DIAGNOSIS — E8809 Other disorders of plasma-protein metabolism, not elsewhere classified: Secondary | ICD-10-CM | POA: Diagnosis not present

## 2022-06-03 DIAGNOSIS — Z7189 Other specified counseling: Secondary | ICD-10-CM | POA: Diagnosis not present

## 2022-06-03 DIAGNOSIS — J982 Interstitial emphysema: Secondary | ICD-10-CM

## 2022-06-03 DIAGNOSIS — J850 Gangrene and necrosis of lung: Secondary | ICD-10-CM | POA: Diagnosis not present

## 2022-06-03 DIAGNOSIS — J939 Pneumothorax, unspecified: Secondary | ICD-10-CM

## 2022-06-03 DIAGNOSIS — J189 Pneumonia, unspecified organism: Secondary | ICD-10-CM | POA: Diagnosis present

## 2022-06-03 DIAGNOSIS — E860 Dehydration: Secondary | ICD-10-CM | POA: Diagnosis not present

## 2022-06-03 LAB — COMPREHENSIVE METABOLIC PANEL
ALT: 16 U/L (ref 0–44)
AST: 29 U/L (ref 15–41)
Albumin: 2.1 g/dL — ABNORMAL LOW (ref 3.5–5.0)
Alkaline Phosphatase: 67 U/L (ref 38–126)
Anion gap: 13 (ref 5–15)
BUN: 28 mg/dL — ABNORMAL HIGH (ref 8–23)
CO2: 26 mmol/L (ref 22–32)
Calcium: 8.2 mg/dL — ABNORMAL LOW (ref 8.9–10.3)
Chloride: 95 mmol/L — ABNORMAL LOW (ref 98–111)
Creatinine, Ser: 0.86 mg/dL (ref 0.61–1.24)
GFR, Estimated: >60 mL/min (ref >60)
Glucose, Bld: 136 mg/dL — ABNORMAL HIGH (ref 70–99)
Potassium: 2.8 mmol/L — ABNORMAL LOW (ref 3.5–5.1)
Sodium: 134 mmol/L — ABNORMAL LOW (ref 135–145)
Total Bilirubin: 2.3 mg/dL — ABNORMAL HIGH (ref 0.3–1.2)
Total Protein: 7 g/dL (ref 6.5–8.1)

## 2022-06-03 LAB — MRSA NEXT GEN BY PCR, NASAL: MRSA by PCR Next Gen: NOT DETECTED

## 2022-06-03 LAB — LACTIC ACID, PLASMA
Lactic Acid, Venous: 2.7 mmol/L (ref 0.5–1.9)
Lactic Acid, Venous: 6.3 mmol/L (ref 0.5–1.9)
Lactic Acid, Venous: 3 mmol/L (ref 0.5–1.9)

## 2022-06-03 LAB — CBC
HCT: 24 % — ABNORMAL LOW (ref 39.0–52.0)
Hemoglobin: 8.5 g/dL — ABNORMAL LOW (ref 13.0–17.0)
MCH: 32.2 pg (ref 26.0–34.0)
MCHC: 35.4 g/dL (ref 30.0–36.0)
MCV: 90.9 fL (ref 80.0–100.0)
Platelets: 184 10*3/uL (ref 150–400)
RBC: 2.64 MIL/uL — ABNORMAL LOW (ref 4.22–5.81)
RDW: 15.1 % (ref 11.5–15.5)
WBC: 19.5 10*3/uL — ABNORMAL HIGH (ref 4.0–10.5)
nRBC: 0 % (ref 0.0–0.2)

## 2022-06-03 LAB — STREP PNEUMONIAE URINARY ANTIGEN: Strep Pneumo Urinary Antigen: NEGATIVE

## 2022-06-03 LAB — MAGNESIUM: Magnesium: 1.5 mg/dL — ABNORMAL LOW (ref 1.7–2.4)

## 2022-06-03 LAB — SARS CORONAVIRUS 2 BY RT PCR: SARS Coronavirus 2 by RT PCR: POSITIVE — AB

## 2022-06-03 LAB — PROCALCITONIN: Procalcitonin: 1.13 ng/mL

## 2022-06-03 LAB — PHOSPHORUS: Phosphorus: 1.9 mg/dL — ABNORMAL LOW (ref 2.5–4.6)

## 2022-06-03 MED ORDER — POTASSIUM CHLORIDE 10 MEQ/100ML IV SOLN
10.0000 meq | INTRAVENOUS | Status: DC
  Administered 2022-06-03: 10 meq via INTRAVENOUS
  Filled 2022-06-03: qty 100

## 2022-06-03 MED ORDER — VANCOMYCIN HCL 500 MG/100ML IV SOLN
500.0000 mg | INTRAVENOUS | Status: DC
  Administered 2022-06-04: 500 mg via INTRAVENOUS
  Filled 2022-06-03 (×2): qty 100

## 2022-06-03 MED ORDER — VANCOMYCIN HCL IN DEXTROSE 1-5 GM/200ML-% IV SOLN
1000.0000 mg | Freq: Once | INTRAVENOUS | Status: AC
  Administered 2022-06-03: 1000 mg via INTRAVENOUS
  Filled 2022-06-03: qty 200

## 2022-06-03 MED ORDER — ONDANSETRON HCL 4 MG PO TABS: 4.0000 mg | ORAL_TABLET | Freq: Four times a day (QID) | ORAL | Status: DC | PRN

## 2022-06-03 MED ORDER — GUAIFENESIN 200 MG PO TABS
400.0000 mg | ORAL_TABLET | Freq: Three times a day (TID) | ORAL | Status: DC
  Filled 2022-06-03 (×3): qty 2

## 2022-06-03 MED ORDER — POTASSIUM CHLORIDE 10 MEQ/100ML IV SOLN
10.0000 meq | INTRAVENOUS | Status: AC
  Administered 2022-06-03 (×3): 10 meq via INTRAVENOUS
  Filled 2022-06-03 (×3): qty 100

## 2022-06-03 MED ORDER — LIP MEDEX EX OINT: 1.0000 | TOPICAL_OINTMENT | CUTANEOUS | Status: DC | PRN

## 2022-06-03 MED ORDER — GUAIFENESIN-DM 100-10 MG/5ML PO SYRP
5.0000 mL | ORAL_SOLUTION | ORAL | Status: DC | PRN
  Administered 2022-06-03 – 2022-06-12 (×3): 5 mL via ORAL
  Filled 2022-06-03 (×4): qty 5

## 2022-06-03 MED ORDER — ACETAMINOPHEN 650 MG RE SUPP: 650.0000 mg | Freq: Four times a day (QID) | RECTAL | Status: DC | PRN

## 2022-06-03 MED ORDER — MAGNESIUM SULFATE 2 GM/50ML IV SOLN
2.0000 g | Freq: Once | INTRAVENOUS | Status: AC
  Administered 2022-06-03: 2 g via INTRAVENOUS
  Filled 2022-06-03: qty 50

## 2022-06-03 MED ORDER — SODIUM CHLORIDE 0.9 % IV SOLN: INTRAVENOUS | Status: DC

## 2022-06-03 MED ORDER — DEXTROMETHORPHAN POLISTIREX ER 30 MG/5ML PO SUER
30.0000 mg | Freq: Two times a day (BID) | ORAL | Status: DC
  Administered 2022-06-03: 30 mg via ORAL
  Filled 2022-06-03: qty 5

## 2022-06-03 MED ORDER — PIPERACILLIN-TAZOBACTAM 3.375 G IVPB
3.3750 g | Freq: Three times a day (TID) | INTRAVENOUS | Status: DC
  Administered 2022-06-03 – 2022-06-12 (×28): 3.375 g via INTRAVENOUS
  Filled 2022-06-03 (×29): qty 50

## 2022-06-03 MED ORDER — HALOPERIDOL LACTATE 5 MG/ML IJ SOLN
1.0000 mg | Freq: Once | INTRAMUSCULAR | Status: DC
  Filled 2022-06-03: qty 1

## 2022-06-03 MED ORDER — ONDANSETRON HCL 4 MG/2ML IJ SOLN: 4.0000 mg | Freq: Four times a day (QID) | INTRAMUSCULAR | Status: DC | PRN

## 2022-06-03 MED ORDER — ACETAMINOPHEN 325 MG PO TABS
650.0000 mg | ORAL_TABLET | Freq: Four times a day (QID) | ORAL | Status: DC | PRN
  Filled 2022-06-03: qty 2

## 2022-06-03 MED ORDER — ENOXAPARIN SODIUM 30 MG/0.3ML IJ SOSY
30.0000 mg | PREFILLED_SYRINGE | INTRAMUSCULAR | Status: DC
  Administered 2022-06-03 – 2022-06-04 (×2): 30 mg via SUBCUTANEOUS
  Filled 2022-06-03 (×2): qty 0.3

## 2022-06-03 MED ORDER — HALOPERIDOL LACTATE 5 MG/ML IJ SOLN
1.0000 mg | Freq: Once | INTRAMUSCULAR | Status: AC
  Administered 2022-06-03: 1 mg via INTRAVENOUS

## 2022-06-03 NOTE — Consult Note (Signed)
Consultation Note Date: 06/03/2022   Patient Name: Spencer Vargas  DOB: [birth year]  MRN: [id number]  Age / Sex: 76 y.o., male  PCP: Gareth Morgan, MD Referring Physician: Erick Blinks, DO  Reason for Consultation: Establishing goals of care  HPI/Patient Profile: 76 y.o. male  with past medical history of no significant medical history, noted to not seen a physician in about 15 years or more admitted on 06/02/2022 with necrotizing pneumonia.   Clinical Assessment and Goals of Care: I have reviewed medical records including EPIC notes, labs and imaging, received report from RN, assessed the patient.  Mr. Nauta is lying quietly in bed.  He appears acutely/chronically ill and very frail, cachectic.  He is alert, will briefly make but not keep eye contact.  He is able to whisper his name, and gives simple one-word answers such as that he is in the hospital.  He clearly cannot make his basic needs known.  His neighbor a 50 plus years, Colin Rhein, is present at bedside.    We meet at bedside to discuss diagnosis prognosis, GOC, EOL wishes, disposition and options.  I introduced Palliative Medicine as specialized medical care for people living with serious illness. It focuses on providing relief from the symptoms and stress of a serious illness. The goal is to improve quality of life for both the patient and the family.  We discussed a brief life review of the patient.  Mr. Duran never married and had no children.  His parents are deceased.  His brother died 2 or 3 months ago again childless.  He has no immediate family.  Cindee Lame and Lawrence Santiago have been neighbors for 50+ years.  We then focused on their current illness.  Cindee Lame states that Mr. Rohwer had been living independently and doing okay approximately 8 weeks ago.  He tells me that severe weight loss started approximately 6 weeks ago.  We talk about Mr.  Weible's pneumonia, and in particular necrotizing pneumonia.  We talk about the treatment plan.  The natural disease trajectory and expectations at EOL were discussed.  Advanced directives, concepts specific to code status, artifical feeding and hydration, and rehospitalization were considered and discussed.  We talked about the concept of "treat the treatable, but allowing natural passing".  Cindee Lame states that Mr. Foushee has shared his wishes with Patsy and himself and they understand that he would not want to be on life support.  Mr. Vanaman nods in agreement.  We talk about DNR and purple bracelet.  Orders adjusted, goldenrod form completed.  I ask/encouraged Cindee Lame to update Patsy about Mr. Servidio's condition and CODE STATUS changes. Discussed the importance of continued conversation with family and the medical providers regarding overall plan of care and treatment options, ensuring decisions are within the context of the patient's values and GOCs.  Questions and concerns were addressed.   The patient and representative were encouraged to call with questions or concerns.  PMT will continue to support holistically.  Conference with attending, bedside nursing staff, transition of  care team related to patient condition, needs, goals of care, disposition.     HCPOA OTHER -Mr. Weisberg names his healthcare power of attorney as his neighbor of 50 years, [place].  They share there is no formal paperwork.  He is unmarried, has no children.  His brother died 2 or 3 months ago, but also had no children.    SUMMARY OF RECOMMENDATIONS   DNR verified today At this point continue to treat, time for outcomes   Code Status/Advance Care Planning: DNR -verified with patient and representative at bedside.  Goldenrod form completed.  Symptom Management:  Per hospitalist, no additional needs at this time.  Palliative Prophylaxis:  Oral Care and Turn Reposition  Additional Recommendations (Limitations,  Scope, Preferences): Treat the treatable but no CPR or intubation  Psycho-social/Spiritual:  Desire for further Chaplaincy support:no Additional Recommendations: Caregiving  Support/Resources and Education on Hospice  Prognosis:  Unable to determine, based on outcomes.  Guarded at this point.  2 weeks or less would not be surprising.  Discharge Planning: To be determined, based on outcomes.  Would benefit from hospice care.      Primary Diagnoses: Present on Admission:  Pneumonia   I have reviewed the medical record, interviewed the patient and family, and examined the patient. The following aspects are pertinent.  Past Medical History:  Diagnosis Date   Medical history non-contributory    Social History   Socioeconomic History   Marital status: Single    Spouse name: Not on file   Number of children: Not on file   Years of education: Not on file   Highest education level: Not on file  Occupational History   Not on file  Tobacco Use   Smoking status: Never   Smokeless tobacco: Not on file  Substance and Sexual Activity   Alcohol use: No   Drug use: No   Sexual activity: Not on file  Other Topics Concern   Not on file  Social History Narrative   Not on file   Social Determinants of Health   Financial Resource Strain: Not on file  Food Insecurity: Not on file  Transportation Needs: Not on file  Physical Activity: Not on file  Stress: Not on file  Social Connections: Not on file   Family History  Adopted: Yes   Scheduled Meds:  dextromethorphan-guaiFENesin  1 tablet Oral BID   enoxaparin (LOVENOX) injection  30 mg Subcutaneous Q24H   feeding supplement  237 mL Oral BID BM   Continuous Infusions:  sodium chloride 150 mL/hr at 06/03/22 0843   azithromycin Stopped (06/02/22 2314)   piperacillin-tazobactam (ZOSYN)  IV Stopped (06/03/22 0606)   vancomycin     PRN Meds:.acetaminophen **OR** acetaminophen, ondansetron **OR** ondansetron (ZOFRAN)  IV Medications Prior to Admission:  Prior to Admission medications   Not on File   Allergies  Allergen Reactions   Bee Venom    Review of Systems  Unable to perform ROS: Acuity of condition    Physical Exam Vitals and nursing note reviewed.  Constitutional:      General: He is not in acute distress.    Appearance: He is ill-appearing.     Comments: Frail and thin, cachectic  HENT:     Mouth/Throat:     Mouth: Mucous membranes are dry.  Cardiovascular:     Rate and Rhythm: Normal rate.  Pulmonary:     Effort: Pulmonary effort is normal. No respiratory distress.  Musculoskeletal:     Comments: Severe muscle wasting  Skin:    General: Skin is warm and dry.  Neurological:     Mental Status: He is alert.     Comments: Able to give simple answers     Vital Signs: BP 122/62 (BP Location: Left Arm)   Pulse 96   Temp 97.8 F (36.6 C) (Oral)   Resp 20   Ht 5\' 9"  (1.753 m)   Wt 43.1 kg   SpO2 95%   BMI 14.03 kg/m  Pain Scale: 0-10   Pain Score: 0-No pain   SpO2: SpO2: 95 % O2 Device:SpO2: 95 % O2 Flow Rate: .O2 Flow Rate (L/min): 2 L/min  IO: Intake/output summary:  Intake/Output Summary (Last 24 hours) at 06/03/2022 1350 Last data filed at 06/03/2022 1300 Gross per 24 hour  Intake 120 ml  Output --  Net 120 ml    LBM:   Baseline Weight: Weight: 43.1 kg Most recent weight: Weight: 43.1 kg     Palliative Assessment/Data:   Flowsheet Rows    Flowsheet Row Most Recent Value  Intake Tab   Referral Department Hospitalist  Unit at Time of Referral Cardiac/Telemetry Unit  Palliative Care Primary Diagnosis Sepsis/Infectious Disease  Date Notified 06/03/22  Palliative Care Type New Palliative care  Reason for referral Clarify Goals of Care  Date of Admission 06/02/22  Date first seen by Palliative Care 06/03/22  # of days Palliative referral response time 0 Day(s)  # of days IP prior to Palliative referral 1  Clinical Assessment   Palliative Performance  Scale Score 10%  Pain Max last 24 hours Not able to report  Pain Min Last 24 hours Not able to report  Dyspnea Max Last 24 Hours Not able to report  Dyspnea Min Last 24 hours Not able to report  Psychosocial & Spiritual Assessment   Palliative Care Outcomes        Time In: 0930 Time Out:  1025 Time Total: 55 minutes  Greater than 50%  of this time was spent counseling and coordinating care related to the above assessment and plan.  Signed by: Drue Novel, NP   Please contact Palliative Medicine Team phone at [phone] for questions and concerns.  For individual provider: See Shea Evans

## 2022-06-03 NOTE — Progress Notes (Signed)
Pharmacy Antibiotic Note  Spencer Vargas is a 76 y.o. male admitted on 06/02/2022 with  necrotizing pneumonia .  Pharmacy has been consulted for Vancomycin/Zosyn dosing. WBC is elevated. Renal function ok.   Plan: Vancomycin 1000 mg IV x 1, then 500 mg IV q24h >>>Estimated AUC: 421 Zosyn 3.375G IV q8h to be infused over 4 hours Trend WBC, temp, renal function  F/U infectious work-up Drug levels as indicated   Height: 5\' 9"  (175.3 cm) Weight: 43.1 kg (95 lb) IBW/kg (Calculated) : 70.7  Temp (24hrs), Avg:98.1 F (36.7 C), Min:98.1 F (36.7 C), Max:98.1 F (36.7 C)  Recent Labs  Lab 06/02/22 2025 06/02/22 2203  WBC 22.2*  --   CREATININE 0.92  --   LATICACIDVEN 3.6* 2.7*    Estimated Creatinine Clearance: 41 mL/min (by C-G formula based on SCr of 0.92 mg/dL).    Allergies  Allergen Reactions   Bee Venom    Narda Bonds, PharmD, BCPS Clinical Pharmacist Phone: [phone]

## 2022-06-03 NOTE — Consult Note (Addendum)
NAME:  Spencer Vargas, MRN:  [id number], DOB:  1946-02-13, LOS: 1 ADMISSION DATE:  06/02/2022, CONSULTATION DATE:  06/03/22 REFERRING MD:  06/03/22 CHIEF COMPLAINT:   weakness/ ams   History of Present Illness:  17 yowm never smoker with no medical care in years and decline per neighbors x sev months PTA with profound weakness/ ams with evidence of necrotizing pna on CT  with increasing LA and PCCM consulted in ER re  management ? Need for escalation of care   Pertinent  Medical History  76 y.o. cachectic male with no significant medical history presents to the emergency department after being brought in by a neighbor due to increased weakness.  Patient has not seen any physician in more than 15 years.  Patient was unable to provide history possibly due to his current acute illness states, history was obtained from ED physician and neighbor at bedside, per report, patient has productive cough of greenish and yellowish phlegm for more than 1 month and has been having an increasing shortness of breath on exertion over the last several weeks.  Patient was reported to be very active and was able to mow lawns as of early summer, however, he has had significant weight loss within the last few months.  Patient denies chest pain, fever, headache, nausea, vomiting.  Significant Hospital Events: Including procedures, antibiotic start and stop dates in addition to other pertinent events     -  CT chest 9/25 with contrast: 1. Extensive pneumomediastinum with gas seen tracking into the neck base bilaterally and within the left apical subpleural space. 2. Small left pneumothorax without associated mediastinal shift or hyperexpansion to suggest tension physiology. 3. Extensive consolidation within the right lower lobe with central areas of cavitation in keeping with necrotizing pneumonia. Extensive debris within the dependent trachea and right mainstem bronchus without evidence of central obstructing  lesion. 4. Extensive asymmetric architectural distortion within the right lung with extensive cystic change in keeping with advanced varicoid and cystic bronchiectasis. This may reflect the sequela of remote and/or recurrent infection. 5. Scattered areas of subpleural architectural distortion, traction bronchiolectasis, and inter and intra lobular septal thickening within the left lung in keeping with mild scattered areas of subpleural fibrosis. Findings may relate to underlying interstitial lung disease      Interim History / Subjective:  Nonsensical one or two word responses, denies pain   Objective   Blood pressure 129/60, pulse (!) 101, temperature 98 F (36.7 C), temperature source Axillary, resp. rate 16, height 5\' 9"  (1.753 m), weight 43.1 kg, SpO2 97 %.       No intake or output data in the 24 hours ending 06/03/22 0819 Filed Weights   06/02/22 1919  Weight: 43.1 kg    Examination: Tmax:  98 General appearance:    acute and chronically ill pale wm mod increased wob   At Rest 02 sats  97% on RA  No jvd/ no palpable crepitance Oropharynx clear,  dentition poor  Neck supple Lungs with junky inps> exp rhonchi bilaterally much worse on R  RRR no s3 or or sign murmur Abd scaphoid with nl excursion  Extr warm with no edema / muscle wasting and clubbing present Neuro  Sensorium delirious nonsensical verbal responses ,  no apparent motor deficits      Assessment & Plan:  1) Extensive necrotizing pna in pt with poor dentition and probable underlying bronchiectasis with elevated PCT and Lactic Acid but no need for pressors/02 at this point in  a pt who is clearly very chronically ill.  Agree with Dr Sherryll Burger that based on pre-admit hx of wt loss and steady decline x months that prognosis for functional recovery is extremely poor if does not respond to abx /fluids/02  and require higher levels of CCM eg intubation/pressors>  palliative care consult appropriate, ? Hospice on d/c if  survives   Therefore in absence of living will or POA I agree with NCB status and avoid escalation of care above fluids/abx/02   2) severe protein calorie malnutrition is chronic / present on admit  3) Normocyctic anemia of chronic dz likely   4) pre renal azotemia c/w vol depletion > IV fluids per [facility]   5) PTX and pneumomediastinum of no clinical relevance at this point  Best Practice (right click and "Reselect all SmartList Selections" daily)      Labs   CBC: Recent Labs  Lab 06/02/22 2025 06/03/22 0517  WBC 22.2* 19.5*  NEUTROABS 19.7*  --   HGB 9.6* 8.5*  HCT 27.1* 24.0*  MCV 90.9 90.9  PLT 222 184    Basic Metabolic Panel: Recent Labs  Lab 06/02/22 2025 06/03/22 0517  NA 135 134*  K 4.0 2.8*  CL 88* 95*  CO2 32 26  GLUCOSE 128* 136*  BUN 38* 28*  CREATININE 0.92 0.86  CALCIUM 9.2 8.2*  MG  --  1.5*  PHOS  --  1.9*   GFR: Estimated Creatinine Clearance: 43.9 mL/min (by C-G formula based on SCr of 0.86 mg/dL). Recent Labs  Lab 06/02/22 2025 06/02/22 2203 06/03/22 0517  PROCALCITON  --   --  1.13  WBC 22.2*  --  19.5*  LATICACIDVEN 3.6* 2.7* 6.3*    Liver Function Tests: Recent Labs  Lab 06/02/22 2025 06/03/22 0517  AST 17 29  ALT 13 16  ALKPHOS 81 67  BILITOT 3.8* 2.3*  PROT 8.8* 7.0  ALBUMIN 2.8* 2.1*   No results for input(s): "LIPASE", "AMYLASE" in the last 168 hours. No results for input(s): "AMMONIA" in the last 168 hours.  ABG No results found for: "PHART", "PCO2ART", "PO2ART", "HCO3", "TCO2", "ACIDBASEDEF", "O2SAT"   Coagulation Profile: Recent Labs  Lab 06/02/22 2025  INR 1.2    Cardiac Enzymes: No results for input(s): "CKTOTAL", "CKMB", "CKMBINDEX", "TROPONINI" in the last 168 hours.  HbA1C: No results found for: "HGBA1C"  CBG: No results for input(s): "GLUCAP" in the last 168 hours.     Past Medical History:  He,  has a past medical history of Medical history non-contributory.   Surgical History:   Past  Surgical History:  Procedure Laterality Date   CATARACT EXTRACTION W/PHACO Right 11/11/2012   Procedure: CATARACT EXTRACTION PHACO AND INTRAOCULAR LENS PLACEMENT (IOC);  Surgeon: Gemma Payor, MD;  Location: [facility];  Service: Ophthalmology;  Laterality: Right;  CDE: 53.68   CATARACT EXTRACTION W/PHACO Left 11/29/2012   Procedure: CATARACT EXTRACTION PHACO AND INTRAOCULAR LENS PLACEMENT (IOC);  Surgeon: Gemma Payor, MD;  Location: [facility];  Service: Ophthalmology;  Laterality: Left;  CDE 58.63   SPLENECTOMY, TOTAL  1970   [facility]     Social History:   reports that he has never smoked. He does not have any smokeless tobacco history on file. He reports that he does not drink alcohol and does not use drugs.   Family History:  His family history is not on file. He was adopted.   Allergies Allergies  Allergen Reactions   Bee Venom  Home Medications  Prior to Admission medications   Medication Sig Start Date End Date Taking? Authorizing Provider  Besifloxacin HCl (BESIVANCE) 0.6 % SUSP Apply 1 drop to eye 3 (three) times daily. Right eye    [provider]  Bromfenac Sodium (PROLENSA) 0.07 % SOLN Apply 1 drop to eye daily. Right eye    [provider]  cholecalciferol (VITAMIN D) 1000 UNITS tablet Take 1,000 Units by mouth daily.    [provider]  Difluprednate (DUREZOL) 0.05 % EMUL Apply 1 drop to eye 3 (three) times daily. Apply to right eye 3 times daily beginning after surgery    [provider]  Multiple Vitamin (MULTIVITAMIN WITH MINERALS) TABS Take 1 tablet by mouth daily.    [provider]  vitamin B-12 (CYANOCOBALAMIN) 1000 MCG tablet Take 1,000 mcg by mouth daily.    [provider]  vitamin C (ASCORBIC ACID) 500 MG tablet Take 500 mg by mouth daily.    [provider]      The patient is critically ill with multiple organ systems failure and requires high complexity decision making for assessment  and support, frequent evaluation and titration of therapies, application of advanced monitoring technologies and extensive interpretation of multiple databases. Critical Care Time devoted to patient care services described in this note is 45 minutes.   Christinia Gully, MD Pulmonary and [place] [phone]   After 7:00 pm call Elink  [phone]

## 2022-06-03 NOTE — Progress Notes (Signed)
Notified provider of the following: patient with dysphagia, able to suction large amounts of yellow thick secretions orally. Mews is 3 due to respirations 36 and had to increase o2 to 4L nasal cannula. Placed on continuous pulse ox and increased VS every 2 hours.  Patient is also scoring 3 for SIRS screening. Patient could use more respiratoy toileting. Patient is also more alert and oriented to self, place now.

## 2022-06-03 NOTE — Progress Notes (Addendum)
Patient arrived to unit at 0839 oxygen saturation 83% on room air. Placed patient on 3 liters of oxygen, oxygen saturation 94% at 3 liters. Patient had increased restlessness and very dazed, responds to yes and no questions at times. Held patients PO meds, see MAR. Decreased patients oxygen to 2 liters oxygen saturation 97%. MD Manuella Ghazi made aware. No new orders.

## 2022-06-03 NOTE — Progress Notes (Signed)
Received call from adult protective services regarding patient, spoke with [facility] regarding patient.

## 2022-06-03 NOTE — Progress Notes (Signed)
Initial Nutrition Assessment  DOCUMENTATION CODES:   Severe malnutrition in context of acute illness/injury  INTERVENTION:  Regular diet   Ensure Enlive po BID, each supplement provides 350 kcal and 20 grams of protein.   Assist with feeding all meals if patient will allow  NUTRITION DIAGNOSIS:   Severe Malnutrition related to catabolic illness, acute illness as evidenced by severe fat depletion, severe muscle depletion.   GOAL:  Patient will meet greater than or equal to 90% of their needs (if feasible given the nature of his illness)  MONITOR:  PO intake, Supplement acceptance, Labs  REASON FOR ASSESSMENT:   Consult Assessment of nutrition requirement/status  ASSESSMENT: Patient is an underweight 76 yo male who presents from home with necrotizing pneumonia.   Palliative following addressing goals of care. DNR.  Patient unable to provide nutrition history. He is pulling on his bedding. Patient neighbor present and says before illness patient was active working in his yard. At baseline patient eats out 3 meals daily.   Patient last meal unknown. His lunch tray is here and untouched. His friend/neighbor has been encouraging him to eat something. His Ensure is also here but 0% consumed.  Medications: reviewed. IVF-NS @ 150 ml/hr     Latest Ref Rng & Units 06/03/2022    5:17 AM 06/02/2022    8:25 PM  BMP  Glucose 70 - 99 mg/dL 136  128   BUN 8 - 23 mg/dL 28  38   Creatinine 0.61 - 1.24 mg/dL 0.86  0.92   Sodium 135 - 145 mmol/L 134  135   Potassium 3.5 - 5.1 mmol/L 2.8  4.0   Chloride 98 - 111 mmol/L 95  88   CO2 22 - 32 mmol/L 26  32   Calcium 8.9 - 10.3 mg/dL 8.2  9.2       NUTRITION - FOCUSED PHYSICAL EXAM:  Flowsheet Row Most Recent Value  Orbital Region Moderate depletion  Upper Arm Region Severe depletion  Thoracic and Lumbar Region Severe depletion  Buccal Region Moderate depletion  Temple Region Moderate depletion  Clavicle Bone Region Severe  depletion  Clavicle and Acromion Bone Region Severe depletion  Scapular Bone Region Unable to assess  Dorsal Hand Severe depletion  Patellar Region Severe depletion  Anterior Thigh Region Severe depletion  Posterior Calf Region Severe depletion  Edema (RD Assessment) None  Hair Unable to assess  Eyes Reviewed  Mouth Unable to assess  Skin Reviewed  Nails Reviewed       Diet Order:   Diet Order             Diet regular Room service appropriate? Yes; Fluid consistency: Thin  Diet effective now                   EDUCATION NEEDS:  Not appropriate for education at this time  Skin:  Skin Assessment: Reviewed RN Assessment  Last BM:  unknown  Height:   Ht Readings from Last 1 Encounters:  06/02/22 5\' 9"  (1.753 m)    Weight:   Wt Readings from Last 1 Encounters:  06/02/22 43.1 kg    Ideal Body Weight:   73 kg  BMI:  Body mass index is 14.03 kg/m.  Estimated Nutritional Needs:   Kcal:  1600-1700  Protein:  73-82 gr  Fluid:  >1500 ml daily  Colman Cater MS,RD,CSG,LDN Contact: Shea Evans

## 2022-06-03 NOTE — TOC Progression Note (Signed)
  Transition of Care [facility]) Screening Note   Patient Details  Name: Spencer Vargas Date of Birth: 1946-04-27   Transition of Care [facility]) CM/SW Contact:    Boneta Lucks, RN Phone Number: 06/03/2022, 11:42 AM  Palliative consulted   Transition of Care Department [facility]) has reviewed patient and no TOC needs have been identified at this time. We will continue to monitor patient advancement through interdisciplinary progression rounds. If new patient transition needs arise, please place a TOC consult.      Barriers to Discharge: Continued Medical Work up

## 2022-06-03 NOTE — Progress Notes (Signed)
PROGRESS NOTE    Spencer Vargas  DZH:[id number] DOB: 08-03-1946 DOA: 06/02/2022 PCP: Lemmie Evens, MD   Brief Narrative:    Spencer Vargas is a 76 y.o. cachectic male with no significant medical history presents to the emergency department after being brought in by a neighbor due to increased weakness.  Patient was admitted with sepsis secondary to necrotizing pneumonia and is noted to have pneumomediastinum as well as pneumothorax.  He appears near end-of-life and is quite frail.  Pulmonology and palliative consulted.  He is now a DO NOT RESUSCITATE and would likely require comfort care soon if he does not improve.  Assessment & Plan:   Principal Problem:   Necrotizing pneumonia ([place]) Active Problems:   Lactic acidosis   Failure to thrive in adult   Dehydration   Weight loss   Hypoalbuminemia due to protein-calorie malnutrition (HCC)   Pneumothorax   Pneumomediastinum (HCC)   Pneumonia  Assessment and Plan:   Sepsis secondary to necrotizing pneumonia Patient was tachypneic, tachycardic and presents with leukocytosis (met SIRS criteria) Chest x-ray was suggestive of pneumonia (met sepsis criteria) Patient was started on vancomycin, Zosyn and azithromycin, we shall continue same at this time with plan to de-escalate/discontinue based on blood culture, sputum culture, urine Legionella, strep pneumo and procalcitonin Continue Tylenol as needed Continue Mucinex, incentive spirometry, flutter valve  Continue current antibiotics, appreciate pulmonology recommendations Palliative consulted and patient is a candidate for hospice care and appears to be near end-of-life   Lactic acidosis possibly secondary to above Lactic acidosis increasing, continue to trend Aggressive IV fluid   Pneumothorax and pneumomediastinum CT chest with contrast showed small left pneumothorax without associated mediastinal shift or hyperexpansion to suggest tension physiology. Pulmonology  consultation appreciated Palliative appreciated and patient now DNR  Hypokalemia/hypomagnesemia Replete and reevaluate in a.m.   Possible underlying interstitial lung disease Pulmonology will be consulted and pneumonia shall await further recommendation   Dehydration Continue IV hydration aggressively due to lactic acidosis   Hypoalbuminemia possibly secondary to moderate protein calorie malnutrition Failure to thrive in adult/deconditioning Weight loss (BMI 14.03) Albumin 2.8, protein supplement to be provided Dietitian will be consulted and we shall await further recommendations     DVT prophylaxis: Lovenox Code Status: DNR Family Communication: Discussed with friends at bedside 9/26 Disposition Plan:  Status is: Inpatient Remains inpatient appropriate because: IV medications.   Consultants:  Palliative care Pulmonology  Procedures:  None  Antimicrobials:  Anti-infectives (From admission, onward)    Start     Dose/Rate Route Frequency Ordered Stop   06/03/22 2200  vancomycin (VANCOREADY) IVPB 500 mg/100 mL        500 mg 100 mL/hr over 60 Minutes Intravenous Every 24 hours 06/03/22 0112     06/03/22 0200  piperacillin-tazobactam (ZOSYN) IVPB 3.375 g        3.375 g 12.5 mL/hr over 240 Minutes Intravenous Every 8 hours 06/03/22 0108     06/03/22 0115  vancomycin (VANCOCIN) IVPB 1000 mg/200 mL premix        1,000 mg 200 mL/hr over 60 Minutes Intravenous  Once 06/03/22 0107 06/03/22 0509   06/02/22 2200  cefTRIAXone (ROCEPHIN) 2 g in sodium chloride 0.9 % 100 mL IVPB  Status:  Discontinued        2 g 200 mL/hr over 30 Minutes Intravenous Every 24 hours 06/02/22 2147 06/03/22 0108   06/02/22 2200  azithromycin (ZITHROMAX) 500 mg in sodium chloride 0.9 % 250 mL IVPB  500 mg 250 mL/hr over 60 Minutes Intravenous Every 24 hours 06/02/22 2147 06/07/22 2159       Subjective: Patient seen and evaluated today he mumbles incomprehensibly, but appears to deny  pain.  Objective: Vitals:   06/03/22 0630 06/03/22 0736 06/03/22 0839 06/03/22 0846  BP: 129/60  (!) 141/61   Pulse: (!) 101  (!) 104   Resp: 16  20   Temp:  98 F (36.7 C) 98.6 F (37 C)   TempSrc:  Axillary Axillary   SpO2: 97%  (!) 83% 94%  Weight:      Height:       No intake or output data in the 24 hours ending 06/03/22 1154 Filed Weights   06/02/22 1919  Weight: 43.1 kg    Examination:  General exam: Appears frail and cachectic Respiratory system: Clear to auscultation. Respiratory effort normal. Cardiovascular system: S1 & S2 heard, RRR.  Gastrointestinal system: Abdomen is soft Central nervous system: Alert and awake Extremities: No edema Skin: No significant lesions noted Psychiatry: Flat affect.    Data Reviewed: I have personally reviewed following labs and imaging studies  CBC: Recent Labs  Lab 06/02/22 2025 06/03/22 0517  WBC 22.2* 19.5*  NEUTROABS 19.7*  --   HGB 9.6* 8.5*  HCT 27.1* 24.0*  MCV 90.9 90.9  PLT 222 465   Basic Metabolic Panel: Recent Labs  Lab 06/02/22 2025 06/03/22 0517  NA 135 134*  K 4.0 2.8*  CL 88* 95*  CO2 32 26  GLUCOSE 128* 136*  BUN 38* 28*  CREATININE 0.92 0.86  CALCIUM 9.2 8.2*  MG  --  1.5*  PHOS  --  1.9*   GFR: Estimated Creatinine Clearance: 43.9 mL/min (by C-G formula based on SCr of 0.86 mg/dL). Liver Function Tests: Recent Labs  Lab 06/02/22 2025 06/03/22 0517  AST 17 29  ALT 13 16  ALKPHOS 81 67  BILITOT 3.8* 2.3*  PROT 8.8* 7.0  ALBUMIN 2.8* 2.1*   No results for input(s): "LIPASE", "AMYLASE" in the last 168 hours. No results for input(s): "AMMONIA" in the last 168 hours. Coagulation Profile: Recent Labs  Lab 06/02/22 2025  INR 1.2   Cardiac Enzymes: No results for input(s): "CKTOTAL", "CKMB", "CKMBINDEX", "TROPONINI" in the last 168 hours. BNP (last 3 results) No results for input(s): "PROBNP" in the last 8760 hours. HbA1C: No results for input(s): "HGBA1C" in the last 72  hours. CBG: No results for input(s): "GLUCAP" in the last 168 hours. Lipid Profile: No results for input(s): "CHOL", "HDL", "LDLCALC", "TRIG", "CHOLHDL", "LDLDIRECT" in the last 72 hours. Thyroid Function Tests: No results for input(s): "TSH", "T4TOTAL", "FREET4", "T3FREE", "THYROIDAB" in the last 72 hours. Anemia Panel: No results for input(s): "VITAMINB12", "FOLATE", "FERRITIN", "TIBC", "IRON", "RETICCTPCT" in the last 72 hours. Sepsis Labs: Recent Labs  Lab 06/02/22 2025 06/02/22 2203 06/03/22 0517 06/03/22 0802  PROCALCITON  --   --  1.13  --   LATICACIDVEN 3.6* 2.7* 6.3* 3.0*    Recent Results (from the past 240 hour(s))  Culture, blood (Routine x 2)     Status: None (Preliminary result)   Collection Time: 06/02/22  8:25 PM   Specimen: Blood  Result Value Ref Range Status   Specimen Description RIGHT ANTECUBITAL  Final   Special Requests   Final    BOTTLES DRAWN AEROBIC AND ANAEROBIC Blood Culture adequate volume Performed at [facility], [address] [place], [place] [address]    Culture PENDING  Incomplete   Report  Status PENDING  Incomplete  Culture, blood (Routine x 2)     Status: None (Preliminary result)   Collection Time: 06/02/22  8:25 PM   Specimen: Blood  Result Value Ref Range Status   Specimen Description LEFT ANTECUBITAL  Final   Special Requests   Final    BOTTLES DRAWN AEROBIC AND ANAEROBIC Blood Culture adequate volume Performed at [facility], [address] [place], [place] [address]    Culture PENDING  Incomplete   Report Status PENDING  Incomplete  SARS Coronavirus 2 by RT PCR (hospital order, performed in [place] hospital lab) *cepheid single result test* Anterior Nasal Swab     Status: Abnormal   Collection Time: 06/03/22  7:53 AM   Specimen: Anterior Nasal Swab  Result Value Ref Range Status   SARS Coronavirus 2 by RT PCR POSITIVE (A) NEGATIVE Final    Comment: (NOTE) SARS-CoV-2 target nucleic acids are DETECTED  SARS-CoV-2 RNA is  generally detectable in upper respiratory specimens  during the acute phase of infection.  Positive results are indicative  of the presence of the identified virus, but do not rule out bacterial infection or co-infection with other pathogens not detected by the test.  Clinical correlation with patient history and  other diagnostic information is necessary to determine patient infection status.  The expected result is negative.  Fact Sheet for Patients:   https://www.patel.info/   Fact Sheet for Healthcare Providers:   https://hall.com/    This test is not yet approved or cleared by the [place] FDA and  has been authorized for detection and/or diagnosis of SARS-CoV-2 by FDA under an Emergency Use Authorization (EUA).  This EUA will remain in effect (meaning this test can be used) for the duration of  the COVID-19 declaration under Section 564(b)(1)  of the Act, 21 U.S.C. section 360-bbb-3(b)(1), unless the authorization is terminated or revoked sooner.   Performed at [facility], [address], [place], [place] [address]          Radiology Studies: CT Chest W Contrast  Result Date: 06/02/2022 CLINICAL DATA:  Pneumonia, complication suspected. Abnormal chest x-ray. EXAM: CT CHEST WITH CONTRAST TECHNIQUE: Multidetector CT imaging of the chest was performed during intravenous contrast administration. RADIATION DOSE REDUCTION: This exam was performed according to the departmental dose-optimization program which includes automated exposure control, adjustment of the mA and/or kV according to patient size and/or use of iterative reconstruction technique. CONTRAST:  87m OMNIPAQUE IOHEXOL 300 MG/ML  SOLN COMPARISON:  None Available. FINDINGS: Cardiovascular: Cardiac size within normal limits. No significant coronary artery calcification. No pericardial effusion. Central pulmonary arteries are of normal caliber. The thoracic aorta is  unremarkable. Mediastinum/Nodes: There is extensive pneumomediastinum with gas seen tracking into the neck base bilaterally and within the left apical subpleural space. The visualized thyroid is unremarkable. No pathologic thoracic adenopathy. The esophagus is unremarkable. There is extensive layering debris within the distal trachea and within the right mainstem bronchus. Lungs/Pleura: There is asymmetric right-sided volume loss. There is extensive asymmetric architectural distortion within the right lung demonstrating subpleural and basilar predominance. There is extensive cystic change within these regions which are in keeping with advanced varicoid and cystic bronchiectasis, best appreciated on coronal imaging. This may reflect the sequela of remote or recurrent infection. There is superimposed extensive consolidation within the a right lower lobe with central areas of cavitation in keeping with superimposed necrotizing pneumonia. Scattered infiltrate is also seen within the right upper lobe. Within the left lung, there is  mild, scattered areas of subpleural architectural distortion, traction bronchiolectasis, and inter and intra lobular septal thickening in keeping with mild scattered areas of subpleural fibrosis. Findings may relate to underlying interstitial lung disease that this is not well assessed given the overlying acute inflammatory process. Small left pneumothorax is present without associated mediastinal shift or hyperexpansion to suggest tension physiology. No pleural effusion. Upper Abdomen: No acute abnormality. Musculoskeletal: No acute bone abnormality. No lytic or blastic bone lesion. IMPRESSION: 1. Extensive pneumomediastinum with gas seen tracking into the neck base bilaterally and within the left apical subpleural space. 2. Small left pneumothorax without associated mediastinal shift or hyperexpansion to suggest tension physiology. 3. Extensive consolidation within the right lower lobe with  central areas of cavitation in keeping with necrotizing pneumonia. Extensive debris within the dependent trachea and right mainstem bronchus without evidence of central obstructing lesion. 4. Extensive asymmetric architectural distortion within the right lung with extensive cystic change in keeping with advanced varicoid and cystic bronchiectasis. This may reflect the sequela of remote and/or recurrent infection. 5. Scattered areas of subpleural architectural distortion, traction bronchiolectasis, and inter and intra lobular septal thickening within the left lung in keeping with mild scattered areas of subpleural fibrosis. Findings may relate to underlying interstitial lung disease that this is not well assessed given the overlying acute inflammatory process. High-resolution CT examination of the chest may be helpful for further evaluation once the patient's acute issues have resolved. Electronically Signed   By: Fidela Salisbury M.D.   On: 06/02/2022 23:22   DG Chest 2 View  Result Date: 06/02/2022 CLINICAL DATA:  Suspect sepsis EXAM: CHEST - 2 VIEW COMPARISON:  Chest x-ray 04/09/2012 FINDINGS: There is diffuse right lung airspace disease with dense consolidation in the right mid lung. The cardiomediastinal silhouette is within normal limits for degree of patient rotation. There is no pleural effusion or pneumothorax. Questionable small nodular density in the left lung apex. No acute fractures are seen. IMPRESSION: 1. Diffuse right lung airspace disease with dense consolidation in the right mid lung. Findings are worrisome for infection. Follow-up short-term chest x-ray recommended to confirm resolution. 2. Questionable nodular density in the left lung apex nonemergent chest CT recommended. Electronically Signed   By: Ronney Asters M.D.   On: 06/02/2022 19:45        Scheduled Meds:  dextromethorphan-guaiFENesin  1 tablet Oral BID   enoxaparin (LOVENOX) injection  30 mg Subcutaneous Q24H   feeding  supplement  237 mL Oral BID BM   Continuous Infusions:  sodium chloride 150 mL/hr at 06/03/22 0843   azithromycin Stopped (06/02/22 2314)   piperacillin-tazobactam (ZOSYN)  IV Stopped (06/03/22 0606)   potassium chloride 10 mEq (06/03/22 1153)   vancomycin       LOS: 1 day    Time spent: 35 minutes    Spencer Doubek Darleen Crocker, DO [facility]  If 7PM-7AM, please contact night-coverage [email or web address] 06/03/2022, 11:54 AM

## 2022-06-03 NOTE — ED Notes (Addendum)
Dr. Manuella Ghazi to consult pulmonology for appropriate admit to this hospital and/or comfort care.

## 2022-06-03 NOTE — Progress Notes (Signed)
Noted patient pulling at tele monitor, IV and removing oxygen, applied mittens. MD Manuella Ghazi and charge nurse made aware.

## 2022-06-04 DIAGNOSIS — R627 Adult failure to thrive: Secondary | ICD-10-CM | POA: Diagnosis not present

## 2022-06-04 DIAGNOSIS — J850 Gangrene and necrosis of lung: Secondary | ICD-10-CM | POA: Diagnosis not present

## 2022-06-04 DIAGNOSIS — Z7189 Other specified counseling: Secondary | ICD-10-CM | POA: Diagnosis not present

## 2022-06-04 DIAGNOSIS — Z515 Encounter for palliative care: Secondary | ICD-10-CM | POA: Diagnosis not present

## 2022-06-04 LAB — BASIC METABOLIC PANEL
Anion gap: 7 (ref 5–15)
BUN: 18 mg/dL (ref 8–23)
CO2: 26 mmol/L (ref 22–32)
Calcium: 7.4 mg/dL — ABNORMAL LOW (ref 8.9–10.3)
Chloride: 104 mmol/L (ref 98–111)
Creatinine, Ser: 0.66 mg/dL (ref 0.61–1.24)
GFR, Estimated: >60 mL/min (ref >60)
Glucose, Bld: 123 mg/dL — ABNORMAL HIGH (ref 70–99)
Potassium: 2.6 mmol/L — CL (ref 3.5–5.1)
Sodium: 137 mmol/L (ref 135–145)

## 2022-06-04 LAB — VITAMIN B12: Vitamin B-12: <50 pg/mL — ABNORMAL LOW (ref 180–914)

## 2022-06-04 LAB — CBC
HCT: 21.3 % — ABNORMAL LOW (ref 39.0–52.0)
Hemoglobin: 7.2 g/dL — ABNORMAL LOW (ref 13.0–17.0)
MCH: 30.8 pg (ref 26.0–34.0)
MCHC: 33.8 g/dL (ref 30.0–36.0)
MCV: 91 fL (ref 80.0–100.0)
Platelets: 136 10*3/uL — ABNORMAL LOW (ref 150–400)
RBC: 2.34 MIL/uL — ABNORMAL LOW (ref 4.22–5.81)
RDW: 15.6 % — ABNORMAL HIGH (ref 11.5–15.5)
WBC: 23.5 10*3/uL — ABNORMAL HIGH (ref 4.0–10.5)
nRBC: 0 % (ref 0.0–0.2)

## 2022-06-04 LAB — LACTIC ACID, PLASMA: Lactic Acid, Venous: 3.3 mmol/L (ref 0.5–1.9)

## 2022-06-04 LAB — FERRITIN: Ferritin: 1499 ng/mL — ABNORMAL HIGH (ref 24–336)

## 2022-06-04 LAB — IRON AND TIBC
Iron: 10 ug/dL — ABNORMAL LOW (ref 45–182)
Saturation Ratios: 10 % — ABNORMAL LOW (ref 17.9–39.5)
TIBC: 104 ug/dL — ABNORMAL LOW (ref 250–450)
UIBC: 94 ug/dL

## 2022-06-04 LAB — RETICULOCYTES
Immature Retic Fract: 9.6 % (ref 2.3–15.9)
RBC.: 2.33 MIL/uL — ABNORMAL LOW (ref 4.22–5.81)
Retic Count, Absolute: 22.6 10*3/uL (ref 19.0–186.0)
Retic Ct Pct: 1 % (ref 0.4–3.1)

## 2022-06-04 LAB — LEGIONELLA PNEUMOPHILA SEROGP 1 UR AG: L. pneumophila Serogp 1 Ur Ag: NEGATIVE

## 2022-06-04 LAB — FOLATE: Folate: 11.4 ng/mL (ref >5.9)

## 2022-06-04 LAB — MAGNESIUM: Magnesium: 1.7 mg/dL (ref 1.7–2.4)

## 2022-06-04 MED ORDER — POTASSIUM CHLORIDE 10 MEQ/100ML IV SOLN
10.0000 meq | INTRAVENOUS | Status: AC
  Administered 2022-06-04 (×4): 10 meq via INTRAVENOUS
  Filled 2022-06-04: qty 100

## 2022-06-04 MED ORDER — GUAIFENESIN 100 MG/5ML PO LIQD
20.0000 mL | Freq: Three times a day (TID) | ORAL | Status: DC
  Administered 2022-06-04 – 2022-06-13 (×20): 20 mL via ORAL
  Filled 2022-06-04 (×24): qty 20

## 2022-06-04 MED ORDER — DEXTROMETHORPHAN POLISTIREX ER 30 MG/5ML PO SUER
30.0000 mg | Freq: Two times a day (BID) | ORAL | Status: DC
  Administered 2022-06-04 – 2022-06-13 (×16): 30 mg via ORAL
  Filled 2022-06-04 (×20): qty 5

## 2022-06-04 MED ORDER — NIRMATRELVIR/RITONAVIR (PAXLOVID)TABLET: 3.0000 | ORAL_TABLET | Freq: Two times a day (BID) | ORAL | Status: DC

## 2022-06-04 MED ORDER — NIRMATRELVIR/RITONAVIR (PAXLOVID) TABLET (RENAL DOSING)
2.0000 | ORAL_TABLET | Freq: Two times a day (BID) | ORAL | Status: AC
  Administered 2022-06-04 – 2022-06-07 (×8): 2 via ORAL
  Filled 2022-06-04: qty 20

## 2022-06-04 MED ORDER — POTASSIUM CHLORIDE CRYS ER 20 MEQ PO TBCR
40.0000 meq | EXTENDED_RELEASE_TABLET | Freq: Once | ORAL | Status: AC
  Administered 2022-06-04: 40 meq via ORAL
  Filled 2022-06-04: qty 2

## 2022-06-04 NOTE — Progress Notes (Addendum)
RT went in assessed patient and began working with patient with flutter valve. Patient has very weak cough at this time but family friend that is present stated he is more alert in AM and has a better cough. RT placed a sputum cup on room in case patient did cough up something but will not be deep  or NT suctioning patient at this time since patient is alert (pleasantly confused). Continued work with IS/flutter will be on going in this matter.Patient remains on 4LPM [place] with saturation of 99%.

## 2022-06-04 NOTE — TOC Initial Note (Addendum)
Transition of Care [facility]) - Initial/Assessment Note    Patient Details  Name: Spencer Vargas MRN: [id number] Date of Birth: 1945/10/22  Transition [facility]) CM/SW Contact:    Boneta Lucks, RN Phone Number:  TOC confirmed [facility] report was filed. Patrici [phone]  ext [phone]) with  [facility] called back to say she has meet with the patient and spoke with the [facility]. She will continue to work on the case and update TOC. She will be off next 2 days, Patriciaann Clan will follow this case.           Addendum : Patient will need PT eval when stable. Karma Greaser with APS, she will call Aniceto Boss for more details.     Barriers to Discharge: Continued Medical Work up   Patient Goals and CMS Choice Patient states their goals for this hospitalization and ongoing recovery are:: continuing medical workup      Expected Discharge Plan and Services   In-house Referral: Hospice / [place] arrangements for the past 2 months: Single Family Home                     Prior Living Arrangements/Services Living arrangements for the past 2 months: Single Family Home Lives with:: Self           Orientation: : Oriented to Self Alcohol / Substance Use: Not Applicable Psych Involvement: No (comment)  Admission diagnosis:  Pneumonia [J18.9] CAP (community acquired pneumonia) [J18.9] Acute encephalopathy [G93.40] Severe sepsis ([place]) [A41.9, R65.20] Community acquired pneumonia of right upper lobe of lung [J18.9] Patient Active Problem List   Diagnosis Date Noted   Necrotizing pneumonia ([place]) 06/03/2022   Pneumothorax 06/03/2022   Pneumomediastinum ([place]) 06/03/2022   Pneumonia 06/03/2022   Lactic acidosis 06/02/2022   Failure to thrive in adult 06/02/2022   Dehydration 06/02/2022   Weight loss 06/02/2022   Hypoalbuminemia due to protein-calorie malnutrition ([place]) 06/02/2022   PCP:  Lemmie Evens, MD Pharmacy:   [facility] [place], [place] AT [place] [address] [place] [address] Phone: [phone] Fax: [phone]

## 2022-06-04 NOTE — Evaluation (Signed)
Clinical/Bedside Swallow Evaluation Patient Details  Name: Spencer Vargas MRN: [id number] Date of Birth: 1946-05-13  Today's Date: 06/04/2022 Time: SLP Start Time (ACUTE ONLY): 1234 SLP Stop Time (ACUTE ONLY): 1304 SLP Time Calculation (min) (ACUTE ONLY): 30 min  Past Medical History:  Past Medical History:  Diagnosis Date   Medical history non-contributory    Past Surgical History:  Past Surgical History:  Procedure Laterality Date   CATARACT EXTRACTION W/PHACO Right 11/11/2012   Procedure: CATARACT EXTRACTION PHACO AND INTRAOCULAR LENS PLACEMENT (IOC);  Surgeon: Tonny Branch, MD;  Location: [facility];  Service: Ophthalmology;  Laterality: Right;  CDE: 53.68   CATARACT EXTRACTION W/PHACO Left 11/29/2012   Procedure: CATARACT EXTRACTION PHACO AND INTRAOCULAR LENS PLACEMENT (IOC);  Surgeon: Tonny Branch, MD;  Location: [facility];  Service: Ophthalmology;  Laterality: Left;  CDE 58.63   SPLENECTOMY, TOTAL  1970   [facility]   HPI:  Spencer Vargas is a 76 y.o. cachectic male with no significant medical history presents to the emergency department after being brought in by a neighbor due to increased weakness.  Patient was admitted with sepsis secondary to necrotizing pneumonia and is noted to have pneumomediastinum as well as pneumothorax.  He appears near end-of-life and is quite frail.  Pulmonology and palliative consulted.  He is now a DO NOT RESUSCITATE and appears to be clinically improving. BSE requested.    Assessment / Plan / Recommendation  Clinical Impression  Clinical swallow evaluation completed at bedside. Pt's neighbor present and reports that Pt typically eats out 3 meals a day but has not done this in quite some time. Pt states that he previously went to the [employer] and ate salmon, chicken, potatoes, green beans, etc. Oral motor examination reveals muscle/temporal wasting, sarcopenia, and generalized oral weakness. Pt able to follow commands for examination,  but appears intermittently confused and nonsensical at times. Pt assessed with ice chips, thin water, Boost, nectar thick liquids, puree and mechanical soft textures. Pt with slow oral prep with reduced lingual movement and impaired mastication resulting in delayed oral transit and oral residuals with diced peaches. Pt with delayed cough after sequential cup/straw sips thin despite cues to take small sips. Pt with slight decrease in coughing with nectars. Pt states that he typically drinks Sundrop and other sodas. Pt with cavitary PNA of unknown etiology and for how long (Pt has not seen a doctor in ~15 years). Recommend downgrading textures to D1/puree (with consideration to advancement to D2) and thin liquids and SLP to check Pt again tomorrow. Please ensure that Pt is alert and upright for all eating/drinking and PO medications whole in puree and follow with liquid wash. SLP Visit Diagnosis: Dysphagia, unspecified (R13.10)    Aspiration Risk  Moderate aspiration risk;Risk for inadequate nutrition/hydration    Diet Recommendation Dysphagia 1 (Puree);Thin liquid   Liquid Administration via: Cup Medication Administration: Whole meds with puree Supervision: Patient able to self feed;Full supervision/cueing for compensatory strategies Compensations: Slow rate;Small sips/bites Postural Changes: Seated upright at 90 degrees;Remain upright for at least 30 minutes after po intake    Other  Recommendations Oral Care Recommendations: Oral care BID;Staff/trained caregiver to provide oral care Other Recommendations: Clarify dietary restrictions    Recommendations for follow up therapy are one component of a multi-disciplinary discharge planning process, led by the attending physician.  Recommendations may be updated based on patient status, additional functional criteria and insurance authorization.  Follow up Recommendations Skilled nursing-short term rehab (<3 hours/day)  Assistance Recommended at  Discharge Frequent or constant Supervision/Assistance  Functional Status Assessment Patient has had a recent decline in their functional status and demonstrates the ability to make significant improvements in function in a reasonable and predictable amount of time.  Frequency and Duration min 2x/week  1 week       Prognosis Prognosis for Safe Diet Advancement: Guarded Barriers to Reach Goals: Severity of deficits      Swallow Study   General Date of Onset: 06/02/22 HPI: Spencer Vargas is a 76 y.o. cachectic male with no significant medical history presents to the emergency department after being brought in by a neighbor due to increased weakness.  Patient was admitted with sepsis secondary to necrotizing pneumonia and is noted to have pneumomediastinum as well as pneumothorax.  He appears near end-of-life and is quite frail.  Pulmonology and palliative consulted.  He is now a DO NOT RESUSCITATE and appears to be clinically improving. BSE requested. Type of Study: Bedside Swallow Evaluation Previous Swallow Assessment: N/A Diet Prior to this Study: Regular;Thin liquids Temperature Spikes Noted: No Respiratory Status: Room air History of Recent Intubation: No Behavior/Cognition: Alert;Cooperative;Pleasant mood;Confused;Requires cueing Oral Cavity Assessment: Dried secretions Oral Care Completed by SLP: Yes Oral Cavity - Dentition: Poor condition Vision: Functional for self-feeding Self-Feeding Abilities: Able to feed self Patient Positioning: Upright in bed Baseline Vocal Quality: Low vocal intensity;Normal Volitional Cough: Weak;Congested Volitional Swallow: Able to elicit    Oral/Motor/Sensory Function Overall Oral Motor/Sensory Function: Generalized oral weakness   Ice Chips Ice chips: Within functional limits Presentation: Spoon   Thin Liquid Thin Liquid: Impaired Presentation: Cup;Self Fed;Straw Pharyngeal  Phase Impairments: Cough - Delayed    Nectar Thick Nectar Thick  Liquid: Impaired Presentation: Cup;Self Fed;Straw Pharyngeal Phase Impairments: Cough - Delayed   Honey Thick Honey Thick Liquid: Not tested   Puree Puree: Within functional limits Presentation: Spoon   Solid     Solid: Impaired Presentation: Spoon Oral Phase Impairments: Reduced lingual movement/coordination;Impaired mastication Oral Phase Functional Implications: Prolonged oral transit;Oral residue     Thank you,  Genene Churn, [place]  Zakirah Weingart 06/04/2022,1:19 PM

## 2022-06-04 NOTE — Progress Notes (Signed)
Palliative: Spencer Vargas, Spencer Vargas, is resting quietly in bed.  He appears acutely/chronically ill and very frail, cachectic.  He has severe muscle and fat displacement with temporal wasting.  He is alert, but oriented to self only.  I do not believe that he can make his basic needs known.  His neighbor of 86 years, Spencer Vargas, is present at bedside.  Nursing staff is also present attending to needs and giving medications.  We talk about Spencer Vargas's acute and chronic health concerns.  I attempt to speak with him about why he has not seen a physician in so long, but he is unclear, stating he just did not want to.  We also talk about medications, he denies taking any medications.  We talk about his frailty, the need for short-term rehab.  He tells me that he would return to his own home.   Spencer Vargas shares that he feels that Spencer Vargas has made improvement.  We talk about his chronic weight loss over 6 weeks, his muscle and fat wasting, temporal wasting.  We talk about his poor by mouth intake.  He currently declines to take more than a few bites of applesauce or a few sips of nutritional shake from nursing staff at bedside.  We talk about his cavitary pneumonia.  We talk about the treatment plan, and the falls support that IV fluids provide.  I share the concern that once IV fluids are finished, he will not be able to eat and drink enough to sustain himself.  I asked patient if he would be surprised that Spencer Vargas qualifies for hospice care.  He shares that this would surprise him.  We again talk about his short declines over the last 6 to 8 weeks, his cavitary pneumonia, his frailty.  We talk about Spencer Vargas's choices to not see PCP, not take medications.  We talk about Spencer Vargas stating that he would not want to live in a nursing home.  We talk about "meaningful improvement".  I shared that Spencer Vargas would qualify to go to residential hospice, to let nature take its course.  We talk about time for  recovery, it has been 24 hours, certainly by Friday or Saturday it would be clear whether Spencer Vargas is having "meaningful improvements".  I shared that although it would be wonderful if we are able to hear from Spencer Vargas what he does and does not want, this may not be possible.   I ask Spencer Vargas if he were able to speak with his wife about the acuity of Spencer Vargas's condition.  He shares that they did not speak in detail as they "traded shifts" to sit with Spencer Vargas.  I greatly encouraged them to consider what this time looks like and feels like for Spencer Vargas.  Plan:   At this point continue to treat the treatable but no CPR or intubation.  Time for outcomes.  Would benefit from hospice care.  61 minutes Spencer Axe, Spencer Vargas Palliative medicine team Team phone [phone] Greater than 50% of this time was spent counseling and coordinating care related to the above assessment and plan.

## 2022-06-04 NOTE — Progress Notes (Signed)
This nurse and NT assisting in room with neighbor visiting and was able to gain history of patients living situation. Pt lives alone without family, neighbor Abe People) has been keeping the house and yard up. Different neighbors Laurey Arrow and Tessie Fass have since took over and making decisions for pt in regards to getting to the hospital, Laurey Arrow states "we did not know how bad he was, and Abe People would not let them in the house". Laurey Arrow states Abe People tried to get pt to sign a Living Will prior to coming to the hospital and did not succeed. Laurey Arrow also states that he and Patsy love patient and would hate the county to take over his home and his land. APS has been notified. MD and Palliative NP informed.

## 2022-06-04 NOTE — Progress Notes (Signed)
In to patient's room to address beeping IV pump. IV potassium infusion completed, tubing removed and capped. Pt with mumbling conversation, non-sensible gibberish. Pt straighten up in bed in prep for supper.  Turned by back to check EMAR on computer and pt pulled right lower forearm IV out. Dressing applied to IV site. Pt then attempting to pull out right upper arm IV. Mittens placed on pt's hands to discourage pulling at IV lines. New IV started in R) forearm without difficulty.

## 2022-06-04 NOTE — Progress Notes (Addendum)
PROGRESS NOTE    Spencer Vargas  WER:[id number] DOB: [birth year] DOA: 06/02/2022 PCP: Lemmie Evens, MD   Brief Narrative:    Spencer Vargas is a 76 y.o. cachectic male with no significant medical history presents to the emergency department after being brought in by a neighbor due to increased weakness.  Patient was admitted with sepsis secondary to necrotizing pneumonia and is noted to have pneumomediastinum as well as pneumothorax.  He appears near end-of-life and is quite frail.  Pulmonology and palliative consulted.  He is now a DO NOT RESUSCITATE and appears to be clinically improving.   Assessment & Plan:   Principal Problem:   Necrotizing pneumonia ([place]) Active Problems:   Lactic acidosis   Failure to thrive in adult   Dehydration   Weight loss   Hypoalbuminemia due to protein-calorie malnutrition (HCC)   Pneumothorax   Pneumomediastinum (HCC)   Pneumonia  Assessment and Plan:  Sepsis secondary to necrotizing pneumonia with symptomatic COVID infection Patient was tachypneic, tachycardic and presents with leukocytosis (met SIRS criteria) Chest x-ray was suggestive of pneumonia (met sepsis criteria) Plan to start Paxlovid and monitor inflammatory markers; continue isolation precaution Patient was started on vancomycin, Zosyn and azithromycin, discontinue vancomycin as MRSA negative  Blood cultures with no growth noted thus far Continue Tylenol as needed Continue Mucinex, incentive spirometry, flutter valve  Continue current antibiotics, appreciate pulmonology recommendations Palliative consulted and patient is a candidate for hospice care and appears to be near end-of-life   Lactic acidosis possibly secondary to above Lactic acidosis increasing, continue to trend Aggressive IV fluid, decrease rate   Pneumothorax and pneumomediastinum CT chest with contrast showed small left pneumothorax without associated mediastinal shift or hyperexpansion to suggest tension  physiology. Pulmonology consultation appreciated Palliative appreciated and patient now DNR   Hypokalemia/hypomagnesemia Replete and reevaluate in a.m. Repeat labs pending this a.m.  Worsening anemia Lovenox to SCDs Check anemia panel and stool occult Follow CBC Transfuse for hemoglobin less than 7   Possible underlying interstitial lung disease Pulmonology will be consulted and pneumonia shall await further recommendation   Dehydration Continue IV hydration aggressively due to lactic acidosis   Hypoalbuminemia possibly secondary to severe protein calorie malnutrition Failure to thrive in adult/deconditioning Weight loss (BMI 14.03) Albumin 2.8, protein supplement to be provided Appreciate dietitian evaluation for Ensure twice daily     DVT prophylaxis: Lovenox to SCDs Code Status: DNR Family Communication: Discussed with friend at bedside 9/27 Disposition Plan:  Status is: Inpatient Remains inpatient appropriate because: IV medications.     Consultants:  Palliative care Pulmonology   Procedures:  None   Antimicrobials:  Anti-infectives (From admission, onward)    Start     Dose/Rate Route Frequency Ordered Stop   06/03/22 2200  vancomycin (VANCOREADY) IVPB 500 mg/100 mL  Status:  Discontinued        500 mg 100 mL/hr over 60 Minutes Intravenous Every 24 hours 06/03/22 0112 06/04/22 0925   06/03/22 0200  piperacillin-tazobactam (ZOSYN) IVPB 3.375 g        3.375 g 12.5 mL/hr over 240 Minutes Intravenous Every 8 hours 06/03/22 0108     06/03/22 0115  vancomycin (VANCOCIN) IVPB 1000 mg/200 mL premix        1,000 mg 200 mL/hr over 60 Minutes Intravenous  Once 06/03/22 0107 06/03/22 0509   06/02/22 2200  cefTRIAXone (ROCEPHIN) 2 g in sodium chloride 0.9 % 100 mL IVPB  Status:  Discontinued        2 g  200 mL/hr over 30 Minutes Intravenous Every 24 hours 06/02/22 2147 06/03/22 0108   06/02/22 2200  azithromycin (ZITHROMAX) 500 mg in sodium chloride 0.9 % 250 mL IVPB         500 mg 250 mL/hr over 60 Minutes Intravenous Every 24 hours 06/02/22 2147 06/07/22 2159       Subjective: Patient seen and evaluated today with no new acute complaints or concerns. No acute concerns or events noted overnight.  He appears to be more awake and alert and appears to be clinically improving.  He still does not have an appetite and is not eating very much.  Objective: Vitals:   06/03/22 2000 06/03/22 2200 06/04/22 0000 06/04/22 0600  BP: (!) 155/84 (!) 141/72 135/74 (!) 147/82  Pulse: 98 100 99 (!) 101  Resp: (!) 36 (!) 24 (!) 24 (!) 21  Temp: 98.1 F (36.7 C) 98.1 F (36.7 C) 98.2 F (36.8 C) 98.1 F (36.7 C)  TempSrc: Oral Oral Oral Axillary  SpO2: 98% 99%  95%  Weight:      Height:        Intake/Output Summary (Last 24 hours) at 06/04/2022 0925 Last data filed at 06/04/2022 0011 Gross per 24 hour  Intake 2126.84 ml  Output 600 ml  Net 1526.84 ml   Filed Weights   06/02/22 1919 06/03/22 0839  Weight: 43.1 kg 45.4 kg    Examination:  General exam: Appears calm and comfortable, frail/cachectic Respiratory system: Clear to auscultation. Respiratory effort normal.  2 L nasal cannula Cardiovascular system: S1 & S2 heard, RRR.  Gastrointestinal system: Abdomen is soft Central nervous system: Alert and awake Extremities: No edema Skin: No significant lesions noted Psychiatry: Flat affect.    Data Reviewed: I have personally reviewed following labs and imaging studies  CBC: Recent Labs  Lab 06/02/22 2025 06/03/22 0517  WBC 22.2* 19.5*  NEUTROABS 19.7*  --   HGB 9.6* 8.5*  HCT 27.1* 24.0*  MCV 90.9 90.9  PLT 222 354   Basic Metabolic Panel: Recent Labs  Lab 06/02/22 2025 06/03/22 0517  NA 135 134*  K 4.0 2.8*  CL 88* 95*  CO2 32 26  GLUCOSE 128* 136*  BUN 38* 28*  CREATININE 0.92 0.86  CALCIUM 9.2 8.2*  MG  --  1.5*  PHOS  --  1.9*   GFR: Estimated Creatinine Clearance: 46.2 mL/min (by C-G formula based on SCr of 0.86  mg/dL). Liver Function Tests: Recent Labs  Lab 06/02/22 2025 06/03/22 0517  AST 17 29  ALT 13 16  ALKPHOS 81 67  BILITOT 3.8* 2.3*  PROT 8.8* 7.0  ALBUMIN 2.8* 2.1*   No results for input(s): "LIPASE", "AMYLASE" in the last 168 hours. No results for input(s): "AMMONIA" in the last 168 hours. Coagulation Profile: Recent Labs  Lab 06/02/22 2025  INR 1.2   Cardiac Enzymes: No results for input(s): "CKTOTAL", "CKMB", "CKMBINDEX", "TROPONINI" in the last 168 hours. BNP (last 3 results) No results for input(s): "PROBNP" in the last 8760 hours. HbA1C: No results for input(s): "HGBA1C" in the last 72 hours. CBG: No results for input(s): "GLUCAP" in the last 168 hours. Lipid Profile: No results for input(s): "CHOL", "HDL", "LDLCALC", "TRIG", "CHOLHDL", "LDLDIRECT" in the last 72 hours. Thyroid Function Tests: No results for input(s): "TSH", "T4TOTAL", "FREET4", "T3FREE", "THYROIDAB" in the last 72 hours. Anemia Panel: No results for input(s): "VITAMINB12", "FOLATE", "FERRITIN", "TIBC", "IRON", "RETICCTPCT" in the last 72 hours. Sepsis Labs: Recent Labs  Lab 06/02/22 2025 06/02/22  2203 06/03/22 0517 06/03/22 0802  PROCALCITON  --   --  1.13  --   LATICACIDVEN 3.6* 2.7* 6.3* 3.0*    Recent Results (from the past 240 hour(s))  Culture, blood (Routine x 2)     Status: None (Preliminary result)   Collection Time: 06/02/22  8:25 PM   Specimen: Right Antecubital; Blood  Result Value Ref Range Status   Specimen Description RIGHT ANTECUBITAL  Final   Special Requests   Final    BOTTLES DRAWN AEROBIC AND ANAEROBIC Blood Culture adequate volume   Culture   Final    NO GROWTH 2 DAYS Performed at [facility], [address], [place], [place] [address]    Report Status PENDING  Incomplete  Culture, blood (Routine x 2)     Status: None (Preliminary result)   Collection Time: 06/02/22  8:25 PM   Specimen: Left Antecubital; Blood  Result Value Ref Range Status   Specimen  Description LEFT ANTECUBITAL  Final   Special Requests   Final    BOTTLES DRAWN AEROBIC AND ANAEROBIC Blood Culture adequate volume   Culture   Final    NO GROWTH 2 DAYS Performed at [facility], [address] [place], [place] [address]    Report Status PENDING  Incomplete  SARS Coronavirus 2 by RT PCR (hospital order, performed in [place] hospital lab) *cepheid single result test* Anterior Nasal Swab     Status: Abnormal   Collection Time: 06/03/22  7:53 AM   Specimen: Anterior Nasal Swab  Result Value Ref Range Status   SARS Coronavirus 2 by RT PCR POSITIVE (A) NEGATIVE Final    Comment: (NOTE) SARS-CoV-2 target nucleic acids are DETECTED  SARS-CoV-2 RNA is generally detectable in upper respiratory specimens  during the acute phase of infection.  Positive results are indicative  of the presence of the identified virus, but do not rule out bacterial infection or co-infection with other pathogens not detected by the test.  Clinical correlation with patient history and  other diagnostic information is necessary to determine patient infection status.  The expected result is negative.  Fact Sheet for Patients:   https://www.patel.info/   Fact Sheet for Healthcare Providers:   https://hall.com/    This test is not yet approved or cleared by the [place] FDA and  has been authorized for detection and/or diagnosis of SARS-CoV-2 by FDA under an Emergency Use Authorization (EUA).  This EUA will remain in effect (meaning this test can be used) for the duration of  the COVID-19 declaration under Section 564(b)(1)  of the Act, 21 U.S.C. section 360-bbb-3(b)(1), unless the authorization is terminated or revoked sooner.   Performed at [facility], [address], [place], [place] [address]   MRSA Next Gen by PCR, Nasal     Status: None   Collection Time: 06/03/22  8:35 AM   Specimen: Nasal Mucosa; Nasal Swab  Result Value Ref Range  Status   MRSA by PCR Next Gen NOT DETECTED NOT DETECTED Final    Comment: (NOTE) The GeneXpert MRSA Assay (FDA approved for NASAL specimens only), is one component of a comprehensive MRSA colonization surveillance program. It is not intended to diagnose MRSA infection nor to guide or monitor treatment for MRSA infections. Test performance is not FDA approved in patients less than 88 years old. Performed at [facility], [address] [place], [place] [address]          Radiology Studies: CT Chest W Contrast  Result Date:  06/02/2022 CLINICAL DATA:  Pneumonia, complication suspected. Abnormal chest x-ray. EXAM: CT CHEST WITH CONTRAST TECHNIQUE: Multidetector CT imaging of the chest was performed during intravenous contrast administration. RADIATION DOSE REDUCTION: This exam was performed according to the departmental dose-optimization program which includes automated exposure control, adjustment of the mA and/or kV according to patient size and/or use of iterative reconstruction technique. CONTRAST:  74m OMNIPAQUE IOHEXOL 300 MG/ML  SOLN COMPARISON:  None Available. FINDINGS: Cardiovascular: Cardiac size within normal limits. No significant coronary artery calcification. No pericardial effusion. Central pulmonary arteries are of normal caliber. The thoracic aorta is unremarkable. Mediastinum/Nodes: There is extensive pneumomediastinum with gas seen tracking into the neck base bilaterally and within the left apical subpleural space. The visualized thyroid is unremarkable. No pathologic thoracic adenopathy. The esophagus is unremarkable. There is extensive layering debris within the distal trachea and within the right mainstem bronchus. Lungs/Pleura: There is asymmetric right-sided volume loss. There is extensive asymmetric architectural distortion within the right lung demonstrating subpleural and basilar predominance. There is extensive cystic change within these regions which are in keeping with  advanced varicoid and cystic bronchiectasis, best appreciated on coronal imaging. This may reflect the sequela of remote or recurrent infection. There is superimposed extensive consolidation within the a right lower lobe with central areas of cavitation in keeping with superimposed necrotizing pneumonia. Scattered infiltrate is also seen within the right upper lobe. Within the left lung, there is mild, scattered areas of subpleural architectural distortion, traction bronchiolectasis, and inter and intra lobular septal thickening in keeping with mild scattered areas of subpleural fibrosis. Findings may relate to underlying interstitial lung disease that this is not well assessed given the overlying acute inflammatory process. Small left pneumothorax is present without associated mediastinal shift or hyperexpansion to suggest tension physiology. No pleural effusion. Upper Abdomen: No acute abnormality. Musculoskeletal: No acute bone abnormality. No lytic or blastic bone lesion. IMPRESSION: 1. Extensive pneumomediastinum with gas seen tracking into the neck base bilaterally and within the left apical subpleural space. 2. Small left pneumothorax without associated mediastinal shift or hyperexpansion to suggest tension physiology. 3. Extensive consolidation within the right lower lobe with central areas of cavitation in keeping with necrotizing pneumonia. Extensive debris within the dependent trachea and right mainstem bronchus without evidence of central obstructing lesion. 4. Extensive asymmetric architectural distortion within the right lung with extensive cystic change in keeping with advanced varicoid and cystic bronchiectasis. This may reflect the sequela of remote and/or recurrent infection. 5. Scattered areas of subpleural architectural distortion, traction bronchiolectasis, and inter and intra lobular septal thickening within the left lung in keeping with mild scattered areas of subpleural fibrosis. Findings may  relate to underlying interstitial lung disease that this is not well assessed given the overlying acute inflammatory process. High-resolution CT examination of the chest may be helpful for further evaluation once the patient's acute issues have resolved. Electronically Signed   By: AFidela SalisburyM.D.   On: 06/02/2022 23:22   DG Chest 2 View  Result Date: 06/02/2022 CLINICAL DATA:  Suspect sepsis EXAM: CHEST - 2 VIEW COMPARISON:  Chest x-ray 04/09/2012 FINDINGS: There is diffuse right lung airspace disease with dense consolidation in the right mid lung. The cardiomediastinal silhouette is within normal limits for degree of patient rotation. There is no pleural effusion or pneumothorax. Questionable small nodular density in the left lung apex. No acute fractures are seen. IMPRESSION: 1. Diffuse right lung airspace disease with dense consolidation in the right mid lung. Findings are worrisome for infection.  Follow-up short-term chest x-ray recommended to confirm resolution. 2. Questionable nodular density in the left lung apex nonemergent chest CT recommended. Electronically Signed   By: Ronney Asters M.D.   On: 06/02/2022 19:45        Scheduled Meds:  guaiFENesin  20 mL Oral Q8H   And   dextromethorphan  30 mg Oral BID   enoxaparin (LOVENOX) injection  30 mg Subcutaneous Q24H   feeding supplement  237 mL Oral BID BM   Continuous Infusions:  sodium chloride 150 mL/hr at 06/04/22 0305   azithromycin 500 mg (06/03/22 2319)   piperacillin-tazobactam (ZOSYN)  IV 3.375 g (06/04/22 0634)     LOS: 2 days    Time spent: 35 minutes    Lashan Macias Darleen Crocker, DO [facility]  If 7PM-7AM, please contact night-coverage [email or web address] 06/04/2022, 9:25 AM

## 2022-06-05 DIAGNOSIS — Z515 Encounter for palliative care: Secondary | ICD-10-CM | POA: Diagnosis not present

## 2022-06-05 DIAGNOSIS — Z7189 Other specified counseling: Secondary | ICD-10-CM | POA: Diagnosis not present

## 2022-06-05 DIAGNOSIS — R627 Adult failure to thrive: Secondary | ICD-10-CM | POA: Diagnosis not present

## 2022-06-05 DIAGNOSIS — J850 Gangrene and necrosis of lung: Secondary | ICD-10-CM | POA: Diagnosis not present

## 2022-06-05 LAB — BASIC METABOLIC PANEL
Anion gap: 5 (ref 5–15)
BUN: 14 mg/dL (ref 8–23)
CO2: 27 mmol/L (ref 22–32)
Calcium: 7.4 mg/dL — ABNORMAL LOW (ref 8.9–10.3)
Chloride: 104 mmol/L (ref 98–111)
Creatinine, Ser: 0.49 mg/dL — ABNORMAL LOW (ref 0.61–1.24)
GFR, Estimated: >60 mL/min (ref >60)
Glucose, Bld: 93 mg/dL (ref 70–99)
Potassium: 2.6 mmol/L — CL (ref 3.5–5.1)
Sodium: 136 mmol/L (ref 135–145)

## 2022-06-05 LAB — MAGNESIUM: Magnesium: 1.5 mg/dL — ABNORMAL LOW (ref 1.7–2.4)

## 2022-06-05 LAB — CBC
HCT: 20.8 % — ABNORMAL LOW (ref 39.0–52.0)
Hemoglobin: 7.5 g/dL — ABNORMAL LOW (ref 13.0–17.0)
MCH: 32.3 pg (ref 26.0–34.0)
MCHC: 36.1 g/dL — ABNORMAL HIGH (ref 30.0–36.0)
MCV: 89.7 fL (ref 80.0–100.0)
Platelets: 101 10*3/uL — ABNORMAL LOW (ref 150–400)
RBC: 2.32 MIL/uL — ABNORMAL LOW (ref 4.22–5.81)
RDW: 15.1 % (ref 11.5–15.5)
WBC: 25.1 10*3/uL — ABNORMAL HIGH (ref 4.0–10.5)
nRBC: 0 % (ref 0.0–0.2)

## 2022-06-05 LAB — C-REACTIVE PROTEIN: CRP: 23.7 mg/dL — ABNORMAL HIGH (ref <1.0)

## 2022-06-05 MED ORDER — POTASSIUM CHLORIDE CRYS ER 20 MEQ PO TBCR
40.0000 meq | EXTENDED_RELEASE_TABLET | Freq: Once | ORAL | Status: AC
  Administered 2022-06-05: 40 meq via ORAL
  Filled 2022-06-05: qty 2

## 2022-06-05 MED ORDER — MAGNESIUM SULFATE 2 GM/50ML IV SOLN
2.0000 g | Freq: Once | INTRAVENOUS | Status: AC
  Administered 2022-06-05: 2 g via INTRAVENOUS
  Filled 2022-06-05: qty 50

## 2022-06-05 MED ORDER — POTASSIUM CHLORIDE 10 MEQ/100ML IV SOLN
10.0000 meq | INTRAVENOUS | Status: AC
  Administered 2022-06-05 (×4): 10 meq via INTRAVENOUS
  Filled 2022-06-05 (×4): qty 100

## 2022-06-05 NOTE — Progress Notes (Signed)
Palliative:    Spencer Vargas is resting quietly in bed.  He appears acutely's chronically ill and frail.  He has severe muscle and fat wasting with temporal wasting.  Although he does look more "plump" today due to receiving fluids.  He is resting comfortably, but wakes when I call his name.  He is alert, making and somewhat keeping eye contact.  He is oriented to self only at this time.  He is unable to tell me where we are.  When asked the month he states that it is "October, maybe November".  I am not sure that he can make his basic needs known.  His neighbor of 62 years, Spencer Vargas, has been present at bedside.  Spencer Vargas states that she feels that Spencer Vargas is much better today.  I shared that although he does look improved, he still looks quite frail.  When asking orientation questions it is clear that his mouth is very dry.  I ask if he would like something to drink and he declines.  I hold the cup to his mouth encouraged him to drink so that we may speak.  Initially, he blows into the straw.  He is able to take a drink without overt signs of aspiration.  We talked about the treatment plan and "meaningful improvements".  We talk about poor by mouth intake, that has been ongoing for quite some time.  Spencer Vargas starts talking about "getting a will and power of attorney".  I shared that there has been an Adult [employer] request, and they will manage Spencer Vargas's concerns at this time.  I share that Spencer Vargas cannot give healthcare power of attorney at this time due to his lack of orientation.  Spencer Vargas uses several "I" statements in relation to care for Spencer Vargas.  Spencer Vargas states that she did not realize Spencer Vargas was so sick, she had not seen him in a while.    Conference with attending, bedside nursing staff, transition of care team related to patient condition, needs, goals of care, disposition.  Plan:   At this point continue to treat the treatable, time for outcomes.  It would be optimal if  Spencer Vargas is alert and oriented enough on a consistent basis to make his own choices.  As of today, Spencer Vargas is not decisional.  Although he was noted to be alert and oriented x3 this morning, he was not during my visit.  73 minutes Spencer Axe, NP Palliative medicine team Team phone [phone] Greater than 50% of this time was spent counseling and coordinating care related to the above assessment and plan.

## 2022-06-05 NOTE — Progress Notes (Signed)
Telemetry called stating that patient had a 6 beat run of vtach at 0430. Informed Dr.Zierle-Ghosh, no new orders and continuing to monitor.

## 2022-06-05 NOTE — Progress Notes (Signed)
Latest Reference Range & Units 06/05/22 47:65  BASIC METABOLIC PANEL  Rpt !!  Sodium 135 - 145 mmol/L 136  Potassium 3.5 - 5.1 mmol/L 2.6 (LL)  Chloride 98 - 111 mmol/L 104  CO2 22 - 32 mmol/L 27  Glucose 70 - 99 mg/dL 93  BUN 8 - 23 mg/dL 14  Creatinine 0.61 - 1.24 mg/dL 0.49 (L)  Calcium 8.9 - 10.3 mg/dL 7.4 (L)  Anion gap 5 - 15  5  Magnesium 1.7 - 2.4 mg/dL 1.5 (L)  GFR, Estimated >60 mL/min >60  WBC 4.0 - 10.5 K/uL 25.1 (H)  RBC 4.22 - 5.81 MIL/uL 2.32 (L)  Hemoglobin 13.0 - 17.0 g/dL 7.5 (L)  HCT 39.0 - 52.0 % 20.8 (L)  MCV 80.0 - 100.0 fL 89.7  MCH 26.0 - 34.0 pg 32.3  MCHC 30.0 - 36.0 g/dL 36.1 (H)  RDW 11.5 - 15.5 % 15.1  Platelets 150 - 400 K/uL 101 (L)  nRBC 0.0 - 0.2 % 0.0  !!: Data is critical (LL): Data is critically low (L): Data is abnormally low (H): Data is abnormally high Rpt: View report in Results Review for more information  Lab called Critical Value Potassium 2.6, Dr. Clearence Ped notified. No new orders at this time.

## 2022-06-05 NOTE — Progress Notes (Signed)
Pharmacy Antibiotic Note  Spencer Vargas is a 76 y.o. male admitted on 06/02/2022 with  necrotizing pneumonia .  Pharmacy has been consulted for Vancomycin/Zosyn dosing. WBC is elevated. Renal function ok.   Plan: Zosyn 3.375G IV q8h to be infused over 4 hours Trend WBC, temp, renal function  F/U infectious work-up    Height: 5\' 9"  (175.3 cm) Weight: 45.4 kg (100 lb 1.4 oz) IBW/kg (Calculated) : 70.7  Temp (24hrs), Avg:98.7 F (37.1 C), Min:98.5 F (36.9 C), Max:98.9 F (37.2 C)  Recent Labs  Lab 06/02/22 2025 06/02/22 2203 06/03/22 0517 06/03/22 0802 06/04/22 1033 06/05/22 0341  WBC 22.2*  --  19.5*  --  23.5* 25.1*  CREATININE 0.92  --  0.86  --  0.66 0.49*  LATICACIDVEN 3.6* 2.7* 6.3* 3.0* 3.3*  --      Estimated Creatinine Clearance: 49.7 mL/min (A) (by C-G formula based on SCr of 0.49 mg/dL (L)).    Allergies  Allergen Reactions   Bee Venom    Paxlovid 9/27 >> Vanco 9/26  Zosyn 9/26 >> Azith 9/25 >>9/29 CTX 9/25  9/25 Bcx: ngtd 9/25 Sputum Cx: pending MRSA PCR: neg   Margot Ables, PharmD Clinical Pharmacist 06/05/2022 8:15 AM
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
## 2022-06-06 DIAGNOSIS — J850 Gangrene and necrosis of lung: Secondary | ICD-10-CM | POA: Diagnosis not present

## 2022-06-06 LAB — BASIC METABOLIC PANEL
Anion gap: 6 (ref 5–15)
BUN: 12 mg/dL (ref 8–23)
CO2: 28 mmol/L (ref 22–32)
Calcium: 7.2 mg/dL — ABNORMAL LOW (ref 8.9–10.3)
Chloride: 102 mmol/L (ref 98–111)
Creatinine, Ser: 0.51 mg/dL — ABNORMAL LOW (ref 0.61–1.24)
GFR, Estimated: >60 mL/min (ref >60)
Glucose, Bld: 90 mg/dL (ref 70–99)
Potassium: 2.7 mmol/L — CL (ref 3.5–5.1)
Sodium: 136 mmol/L (ref 135–145)

## 2022-06-06 LAB — CBC
HCT: 20.1 % — ABNORMAL LOW (ref 39.0–52.0)
Hemoglobin: 7.1 g/dL — ABNORMAL LOW (ref 13.0–17.0)
MCH: 31.7 pg (ref 26.0–34.0)
MCHC: 35.3 g/dL (ref 30.0–36.0)
MCV: 89.7 fL (ref 80.0–100.0)
Platelets: 69 10*3/uL — ABNORMAL LOW (ref 150–400)
RBC: 2.24 MIL/uL — ABNORMAL LOW (ref 4.22–5.81)
RDW: 15.4 % (ref 11.5–15.5)
WBC: 19.5 10*3/uL — ABNORMAL HIGH (ref 4.0–10.5)
nRBC: 0 % (ref 0.0–0.2)

## 2022-06-06 LAB — LACTIC ACID, PLASMA: Lactic Acid, Venous: 1.7 mmol/L (ref 0.5–1.9)

## 2022-06-06 LAB — MAGNESIUM: Magnesium: 1.7 mg/dL (ref 1.7–2.4)

## 2022-06-06 LAB — FERRITIN: Ferritin: 1441 ng/mL — ABNORMAL HIGH (ref 24–336)

## 2022-06-06 LAB — C-REACTIVE PROTEIN: CRP: 18.5 mg/dL — ABNORMAL HIGH (ref <1.0)

## 2022-06-06 MED ORDER — POTASSIUM CHLORIDE CRYS ER 20 MEQ PO TBCR
40.0000 meq | EXTENDED_RELEASE_TABLET | Freq: Once | ORAL | Status: AC
  Administered 2022-06-06: 40 meq via ORAL
  Filled 2022-06-06: qty 2

## 2022-06-06 MED ORDER — POTASSIUM CHLORIDE 10 MEQ/100ML IV SOLN
10.0000 meq | INTRAVENOUS | Status: AC
  Administered 2022-06-06 (×6): 10 meq via INTRAVENOUS
  Filled 2022-06-06 (×6): qty 100
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
## 2022-06-06 NOTE — Progress Notes (Signed)
Date and time results received: 06/06/22 0650 (use smartphrase ".now" to insert current time)  Test: Potassium  Critical Value: 2.7  Name of Provider Notified: Dr. Manuella Ghazi  Orders Received? Or Actions Taken?: Orders Received - See Orders for details

## 2022-06-06 NOTE — Plan of Care (Signed)
  Problem: Acute Rehab PT Goals(only PT should resolve) Goal: Pt Will Go Supine/Side To Sit Outcome: Progressing Flowsheets (Taken 06/06/2022 1443) Pt will go Supine/Side to Sit:  with min guard assist  with minimal assist Goal: Pt Will Go Sit To Supine/Side Outcome: Progressing Flowsheets (Taken 06/06/2022 1443) Pt will go Sit to Supine/Side:  with min guard assist  with minimal assist Goal: Patient Will Transfer Sit To/From Stand Outcome: Progressing Flowsheets (Taken 06/06/2022 1443) Patient will transfer sit to/from stand:  with min guard assist  with minimal assist Goal: Pt Will Transfer Bed To Chair/Chair To Bed Outcome: Progressing Flowsheets (Taken 06/06/2022 1443) Pt will Transfer Bed to Chair/Chair to Bed:  min guard assist  with min assist Goal: Pt Will Ambulate Outcome: Progressing Flowsheets (Taken 06/06/2022 1443) Pt will Ambulate:  25 feet  with minimal assist  with least restrictive assistive device Goal: Pt/caregiver will Perform Home Exercise Program Outcome: Progressing Flowsheets (Taken 06/06/2022 1443) Pt/caregiver will Perform Home Exercise Program:  For increased strengthening  For improved balance  Independently  2:44 PM, 06/06/22 Mearl Latin PT, DPT Physical Therapist at [facility]

## 2022-06-06 NOTE — Evaluation (Signed)
Physical Therapy Evaluation Patient Details Name: Spencer Vargas MRN: [id number] DOB: 1945/10/25 Today's Date: 06/06/2022  History of Present Illness  Spencer Vargas is a 76 y.o. cachectic male with no significant medical history presents to the emergency department after being brought in by a neighbor due to increased weakness.  Patient has not seen any physician in more than 15 years.  Patient was unable to provide history possibly due to his current acute illness states, history was obtained from ED physician and neighbor at bedside, per report, patient has productive cough of greenish and yellowish phlegm for more than 1 month and has been having an increasing shortness of breath on exertion over the last several weeks.  Patient was reported to be very active and was able to mow lawns as of early summer, however, he has had significant weight loss within the last few months.  Patient denies chest pain, fever, headache, nausea, vomiting.   Clinical Impression  Patient limited for functional mobility as stated below secondary to BLE weakness, fatigue and poor standing balance. Patient requires assist to pull to seated EOB with HOB elevated and mobilizes with labored movements requiring increased time. He demonstrates good sitting balance and tolerance while seated EOB. Patient requires cueing, RW, and assist to transfer to standing and demonstrates fair standing balance at bedside with RW. He is limited to a few steps to ambulate to chair at bedside. Patient ends session seated in chair. Patient will benefit from continued physical therapy in hospital and recommended venue below to increase strength, balance, endurance for safe ADLs and gait.        Recommendations for follow up therapy are one component of a multi-disciplinary discharge planning process, led by the attending physician.  Recommendations may be updated based on patient status, additional functional criteria and insurance  authorization.  Follow Up Recommendations Skilled nursing-short term rehab (<3 hours/day) Can patient physically be transported by private vehicle: Yes    Assistance Recommended at Discharge Frequent or constant Supervision/Assistance  Patient can return home with the following  A lot of help with walking and/or transfers;A lot of help with bathing/dressing/bathroom    Equipment Recommendations None recommended by PT  Recommendations for Other Services       Functional Status Assessment Patient has had a recent decline in their functional status and demonstrates the ability to make significant improvements in function in a reasonable and predictable amount of time.     Precautions / Restrictions Precautions Precautions: Fall Restrictions Weight Bearing Restrictions: No      Mobility  Bed Mobility Overal bed mobility: Needs Assistance Bed Mobility: Supine to Sit     Supine to sit: Min assist, Mod assist, HOB elevated     General bed mobility comments: slow, labored, assist to pull to seated EOB    Transfers Overall transfer level: Needs assistance Equipment used: Rolling walker (2 wheels) Transfers: Sit to/from Stand, Bed to chair/wheelchair/BSC Sit to Stand: Mod assist   Step pivot transfers: Min assist, Mod assist       General transfer comment: labored, requires assist and cueing to power up to standing with RW    Ambulation/Gait Ambulation/Gait assistance: Min assist, Mod assist Gait Distance (Feet): 3 Feet Assistive device: Rolling walker (2 wheels) Gait Pattern/deviations: Shuffle Gait velocity: decreased     General Gait Details: slow cadence at bedside to chair  Stairs            Wheelchair Mobility    Modified Rankin (Stroke Patients Only)  Balance Overall balance assessment: Needs assistance Sitting-balance support: No upper extremity supported, Feet supported Sitting balance-Leahy Scale: Fair Sitting balance - Comments:  good/fair seated EOB   Standing balance support: Bilateral upper extremity supported Standing balance-Leahy Scale: Fair Standing balance comment: fair/poor with RW                             Pertinent Vitals/Pain Pain Assessment Pain Assessment: No/denies pain    Home Living Family/patient expects to be discharged to:: Private residence Living Arrangements: Alone   Type of Home: Mobile home Home Access: Stairs to enter Entrance Stairs-Rails: Right;Left;Can reach both Entrance Stairs-Number of Steps: 5   Home Layout: One level Home Equipment: None      Prior Function Prior Level of Function : Independent/Modified Independent             Mobility Comments: Patient states community ambulation without AD ADLs Comments: States independent     Higher education careers adviser        Extremity/Trunk Assessment   Upper Extremity Assessment Upper Extremity Assessment: Generalized weakness    Lower Extremity Assessment Lower Extremity Assessment: Generalized weakness    Cervical / Trunk Assessment Cervical / Trunk Assessment: Normal  Communication   Communication: No difficulties  Cognition Arousal/Alertness: Awake/alert Behavior During Therapy: WFL for tasks assessed/performed Overall Cognitive Status: Within Functional Limits for tasks assessed                                          General Comments      Exercises     Assessment/Plan    PT Assessment Patient needs continued PT services  PT Problem List Decreased strength;Decreased mobility;Decreased activity tolerance;Decreased balance;Cardiopulmonary status limiting activity       PT Treatment Interventions DME instruction;Therapeutic exercise;Gait training;Balance training;Manual techniques;Stair training;Neuromuscular re-education;Functional mobility training;Therapeutic activities;Patient/family education    PT Goals (Current goals can be found in the Care Plan section)  Acute Rehab  PT Goals Patient Stated Goal: return home PT Goal Formulation: With patient Time For Goal Achievement: 06/20/22 Potential to Achieve Goals: Fair    Frequency Min 3X/week     Co-evaluation               AM-PAC PT "6 Clicks" Mobility  Outcome Measure Help needed turning from your back to your side while in a flat bed without using bedrails?: A Little Help needed moving from lying on your back to sitting on the side of a flat bed without using bedrails?: A Lot Help needed moving to and from a bed to a chair (including a wheelchair)?: A Lot Help needed standing up from a chair using your arms (e.g., wheelchair or bedside chair)?: A Lot Help needed to walk in hospital room?: A Lot Help needed climbing 3-5 steps with a railing? : A Lot 6 Click Score: 13    End of Session   Activity Tolerance: Patient tolerated treatment well;Patient limited by fatigue Patient left: in chair;with call bell/phone within reach Nurse Communication: Mobility status PT Visit Diagnosis: Unsteadiness on feet (R26.81);Other abnormalities of gait and mobility (R26.89);Muscle weakness (generalized) (M62.81)    Time: 4098-1191 PT Time Calculation (min) (ACUTE ONLY): 21 min   Charges:   PT Evaluation $PT Eval Moderate Complexity: 1 Mod PT Treatments $Therapeutic Activity: 8-22 mins        2:42 PM, 06/06/22 Reola Mosher.  Alvie Speltz PT, DPT Physical Therapist at [facility]

## 2022-06-06 NOTE — NC FL2 (Signed)
[place] LEVEL OF CARE SCREENING TOOL     IDENTIFICATION  Patient Name: Spencer Vargas Birthdate: 01-28-1946 Sex: male Admission Date (Current Location): 06/02/2022  [facility] and [place] Number:  [employer] and Address:  [place] [address] [place]      Provider Number: [id number]  Attending Physician Name and Address:  Rodena Goldmann, DO  Relative Name and Phone Number:       Current Level of Care: Hospital Recommended Level of Care: [place] Prior Approval Number:    Date Approved/Denied:   PASRR Number: [id number]  Discharge Plan: SNF    Current Diagnoses: Patient Active Problem List   Diagnosis Date Noted   Necrotizing pneumonia ([place]) 06/03/2022   Pneumothorax 06/03/2022   Pneumomediastinum ([place]) 06/03/2022   Pneumonia 06/03/2022   Lactic acidosis 06/02/2022   Failure to thrive in adult 06/02/2022   Dehydration 06/02/2022   Weight loss 06/02/2022   Hypoalbuminemia due to protein-calorie malnutrition ([place]) 06/02/2022    Orientation RESPIRATION BLADDER Height & Weight     Self, Place  O2 (2L) Continent Weight: 45.4 kg Height:  5\' 9"  (175.3 cm)  BEHAVIORAL SYMPTOMS/MOOD NEUROLOGICAL BOWEL NUTRITION STATUS      Continent Diet (see DC summary)  AMBULATORY STATUS COMMUNICATION OF NEEDS Skin   Extensive Assist Verbally Normal                       Personal Care Assistance Level of Assistance  Bathing, Feeding, Dressing Bathing Assistance: Maximum assistance Feeding assistance: Limited assistance Dressing Assistance: Maximum assistance     Functional Limitations Info  Sight, Hearing, Speech Sight Info: Adequate Hearing Info: Adequate Speech Info: Adequate    SPECIAL CARE FACTORS FREQUENCY  PT (By licensed PT)     PT Frequency: 5 times a week              Contractures Contractures Info: Not present    Additional Factors Info  Code Status, Allergies Code  Status Info: DNR Allergies Info: Bee Venom           Current Medications (06/06/2022):  This is the current hospital active medication list Current Facility-Administered Medications  Medication Dose Route Frequency Provider Last Rate Last Admin   acetaminophen (TYLENOL) tablet 650 mg  650 mg Oral Q6H PRN Adefeso, Oladapo, DO       Or   acetaminophen (TYLENOL) suppository 650 mg  650 mg Rectal Q6H PRN Adefeso, Oladapo, DO       azithromycin (ZITHROMAX) 500 mg in sodium chloride 0.9 % 250 mL IVPB  500 mg Intravenous Q24H Adefeso, Oladapo, DO 250 mL/hr at 06/05/22 2211 500 mg at 06/05/22 2211   guaiFENesin (ROBITUSSIN) 100 MG/5ML liquid 20 mL  20 mL Oral Q8H Shah, Pratik D, DO   20 mL at 06/06/22 1546   And   dextromethorphan (DELSYM) 30 MG/5ML liquid 30 mg  30 mg Oral BID Manuella Ghazi, Pratik D, DO   30 mg at 06/06/22 0836   feeding supplement (ENSURE ENLIVE / ENSURE PLUS) liquid 237 mL  237 mL Oral BID BM Madueme, Elvira C, RPH   237 mL at 06/06/22 1547   guaiFENesin-dextromethorphan (ROBITUSSIN DM) 100-10 MG/5ML syrup 5 mL  5 mL Oral Q4H PRN Manuella Ghazi, Pratik D, DO   5 mL at 06/05/22 1204   lip balm (CARMEX) ointment 1 Application  1 Application Topical PRN Heath Lark D, DO       nirmatrelvir/ritonavir  EUA (renal dosing) (PAXLOVID) 2 tablet  2 tablet Oral BID Synthia Innocent, RPH   2 tablet at 06/06/22 0837   ondansetron (ZOFRAN) tablet 4 mg  4 mg Oral Q6H PRN Adefeso, Oladapo, DO       Or   ondansetron (ZOFRAN) injection 4 mg  4 mg Intravenous Q6H PRN Adefeso, Oladapo, DO       piperacillin-tazobactam (ZOSYN) IVPB 3.375 g  3.375 g Intravenous Q8H Stevphen Rochester, RPH 12.5 mL/hr at 06/06/22 1546 3.375 g at 06/06/22 1546   potassium chloride 10 mEq in 100 mL IVPB  10 mEq Intravenous Q1 Hr x 6 Maurilio Lovely D, DO 100 mL/hr at 06/06/22 1540 10 mEq at 06/06/22 1540     Discharge Medications: Please see discharge summary for a list of discharge medications.  Relevant Imaging Results:  Relevant Lab  Results:   Additional Information SS# [id number]  Leitha Bleak, RN

## 2022-06-06 NOTE — TOC Progression Note (Signed)
Transition of Care [facility]) - Progression Note    Patient Details  Name: Spencer Vargas MRN: [id number] Date of Birth: 26-Oct-1945  Transition of [facility]) CM/SW Contact  Boneta Lucks, RN Phone Number: 06/06/2022, 4:41 PM  Clinical Narrative:   PT is recommending SNF. TOC has not heard back from APS. Patient has been more alert today. TOC sending out FL2 for bed offers. Will follow with who willl make his discharge plan.    Expected Discharge Plan: Skilled Nursing Facility Barriers to Discharge: Continued Medical Work up  Expected Discharge Plan and Services Expected Discharge Plan: [place] In-house Referral: Hospice / [facility] arrangements for the past 2 months: [place]

## 2022-06-07 DIAGNOSIS — J850 Gangrene and necrosis of lung: Secondary | ICD-10-CM | POA: Diagnosis not present

## 2022-06-07 DIAGNOSIS — E43 Unspecified severe protein-calorie malnutrition: Secondary | ICD-10-CM | POA: Insufficient documentation

## 2022-06-07 LAB — BASIC METABOLIC PANEL
Anion gap: 8 (ref 5–15)
BUN: 11 mg/dL (ref 8–23)
CO2: 28 mmol/L (ref 22–32)
Calcium: 7.3 mg/dL — ABNORMAL LOW (ref 8.9–10.3)
Chloride: 99 mmol/L (ref 98–111)
Creatinine, Ser: 0.44 mg/dL — ABNORMAL LOW (ref 0.61–1.24)
GFR, Estimated: >60 mL/min (ref >60)
Glucose, Bld: 98 mg/dL (ref 70–99)
Potassium: 3.3 mmol/L — ABNORMAL LOW (ref 3.5–5.1)
Sodium: 135 mmol/L (ref 135–145)

## 2022-06-07 LAB — CULTURE, BLOOD (ROUTINE X 2)
Culture: NO GROWTH
Culture: NO GROWTH
Special Requests: ADEQUATE
Special Requests: ADEQUATE

## 2022-06-07 LAB — CBC
HCT: 22.2 % — ABNORMAL LOW (ref 39.0–52.0)
Hemoglobin: 8 g/dL — ABNORMAL LOW (ref 13.0–17.0)
MCH: 32.4 pg (ref 26.0–34.0)
MCHC: 36 g/dL (ref 30.0–36.0)
MCV: 89.9 fL (ref 80.0–100.0)
Platelets: 57 10*3/uL — ABNORMAL LOW (ref 150–400)
RBC: 2.47 MIL/uL — ABNORMAL LOW (ref 4.22–5.81)
RDW: 15.3 % (ref 11.5–15.5)
WBC: 16.5 10*3/uL — ABNORMAL HIGH (ref 4.0–10.5)
nRBC: 0 % (ref 0.0–0.2)

## 2022-06-07 LAB — MAGNESIUM: Magnesium: 1.5 mg/dL — ABNORMAL LOW (ref 1.7–2.4)

## 2022-06-07 LAB — C-REACTIVE PROTEIN: CRP: 16.5 mg/dL — ABNORMAL HIGH (ref <1.0)

## 2022-06-07 MED ORDER — POTASSIUM CHLORIDE CRYS ER 20 MEQ PO TBCR
40.0000 meq | EXTENDED_RELEASE_TABLET | ORAL | Status: AC
  Administered 2022-06-07 (×2): 40 meq via ORAL
  Filled 2022-06-07 (×2): qty 2

## 2022-06-07 MED ORDER — MAGNESIUM SULFATE 2 GM/50ML IV SOLN
2.0000 g | Freq: Once | INTRAVENOUS | Status: AC
  Administered 2022-06-07: 2 g via INTRAVENOUS
  Filled 2022-06-07: qty 50

## 2022-06-07 NOTE — Progress Notes (Signed)
Patient alert with confusion, repositioned in bed during shift. Patient set-up for lunch, noted patient was feeding self. Patient refused some medications this shift, see MAR.
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
## 2022-06-08 DIAGNOSIS — J850 Gangrene and necrosis of lung: Secondary | ICD-10-CM | POA: Diagnosis not present

## 2022-06-08 LAB — C-REACTIVE PROTEIN: CRP: 13.6 mg/dL — ABNORMAL HIGH (ref <1.0)

## 2022-06-08 MED ORDER — LORAZEPAM 2 MG/ML IJ SOLN
0.5000 mg | Freq: Once | INTRAMUSCULAR | Status: AC
  Administered 2022-06-08: 0.5 mg via INTRAVENOUS
  Filled 2022-06-08: qty 1

## 2022-06-08 MED ORDER — POTASSIUM CHLORIDE IN NACL 20-0.9 MEQ/L-% IV SOLN: INTRAVENOUS | Status: DC

## 2022-06-08 MED ORDER — ALPRAZOLAM 0.5 MG PO TABS
0.5000 mg | ORAL_TABLET | Freq: Once | ORAL | Status: DC
  Filled 2022-06-08: qty 1

## 2022-06-08 NOTE — Progress Notes (Signed)
   06/08/22 0932  Assess: MEWS Score  Temp (!) 97.5 F (36.4 C)  BP (!) 87/53  MAP (mmHg) (!) 64  Pulse Rate 99  Resp (!) 30  Level of Consciousness Alert  SpO2 97 %  O2 Device Nasal Cannula  O2 Flow Rate (L/min) 2 L/min  Assess: MEWS Score  MEWS Temp 0  MEWS Systolic 1  MEWS Pulse 0  MEWS RR 2  MEWS LOC 0  MEWS Score 3  MEWS Score Color Yellow  Assess: if the MEWS score is Yellow or Red  Were vital signs taken at a resting state? Yes  Focused Assessment Change from prior assessment (see assessment flowsheet)  Does the patient meet 2 or more of the SIRS criteria? Yes  Does the patient have a confirmed or suspected source of infection? Yes  Provider and Rapid Response Notified? No (Provider made aware.)  MEWS guidelines implemented *See Row Information* Yes  Treat  Pain Scale 0-10  Pain Score 0  Take Vital Signs  Increase Vital Sign Frequency  Yellow: Q 2hr X 2 then Q 4hr X 2, if remains yellow, continue Q 4hrs  Escalate  MEWS: Escalate Yellow: discuss with charge nurse/RN and consider discussing with provider and RRT  Notify: Charge Nurse/RN  Name of Charge Nurse/RN Notified Zenaida Deed RN  Date Charge Nurse/RN Notified 06/08/22  Time Charge Nurse/RN Notified 0940  Notify: Provider  Provider Name/Title Roxan Hockey, MD  Date Provider Notified 06/08/22  Time Provider Notified [phone]  Method of Notification Page;Rounds (Secure chat)  Notification Reason Change in status (Yellow MEWS)  Provider response At bedside  Date of Provider Response 06/08/22  Time of Provider Response 0945  Assess: SIRS CRITERIA  SIRS Temperature  0  SIRS Pulse 1  SIRS Respirations  1  SIRS WBC 1  SIRS Score Sum  3

## 2022-06-08 NOTE — Progress Notes (Signed)
Went to give patient morning meds, noted patient sitting up in chair, alert to self in a daze. Patient hyperventilating, anxious and rambling speech. Oxygen saturation 945 at 2 liters. Patient placed back in bed, noted patient having heavy breathing. Vital signs: T-97.5 axillary, P-99, R-30, BP-87/53, O2-97% at 2 liters. Patient was pulling at temperature unable to check oral. Noted urine dark in color. MD Courage made aware and Charge nurse aware.
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
## 2022-06-08 NOTE — Progress Notes (Signed)
   06/08/22 0952  Vitals  Temp 97.6 F (36.4 C)  Temp Source Axillary  BP 100/64  MAP (mmHg) 75  BP Location Left Arm  BP Method Automatic  Patient Position (if appropriate) Lying  Pulse Rate 98  Pulse Rate Source Dinamap  Resp (!) 24  Level of Consciousness  Level of Consciousness Alert  MEWS COLOR  MEWS Score Color Yellow  Oxygen Therapy  SpO2 100 %  O2 Device Nasal Cannula  O2 Flow Rate (L/min) 2 L/min  MEWS Score  MEWS Temp 0  MEWS Systolic 1  MEWS Pulse 0  MEWS RR 1  MEWS LOC 0  MEWS Score 2   Pt nurse recorded initial YELLOW MEWS vitals in chart with respirations at 30. MD notified. Upon assessment pt seemed to be anxious and worked up from transferring from bed to chair and back to bed. Respirations on recheck are 24 with bp and pulse stable. Pt is yellow in color and the whites of eyes. Will continue to monitor at this time.

## 2022-06-08 NOTE — Plan of Care (Signed)
  Problem: Clinical Measurements: Goal: Will remain free from infection Outcome: Progressing   Problem: Clinical Measurements: Goal: Diagnostic test results will improve Outcome: Progressing   Problem: Clinical Measurements: Goal: Respiratory complications will improve Outcome: Progressing   Problem: Nutrition: Goal: Adequate nutrition will be maintained Outcome: Progressing   

## 2022-06-08 NOTE — Progress Notes (Signed)
   06/08/22 1134  Assess: MEWS Score  Temp 98.2 F (36.8 C)  BP (!) 145/58  MAP (mmHg) 82  Resp (!) 24  SpO2 100 %  O2 Device Nasal Cannula  O2 Flow Rate (L/min) 2 L/min  Assess: MEWS Score  MEWS Temp 0  MEWS Systolic 0  MEWS Pulse 0  MEWS RR 1  MEWS LOC 0  MEWS Score 1  MEWS Score Color Green  Assess: if the MEWS score is Yellow or Red  Were vital signs taken at a resting state? Yes  Focused Assessment Change from prior assessment (see assessment flowsheet)  Does the patient meet 2 or more of the SIRS criteria? Yes  Does the patient have a confirmed or suspected source of infection? Yes  Provider and Rapid Response Notified? No  MEWS guidelines implemented *See Row Information* No, previously yellow, continue vital signs every 4 hours  Treat  Pain Scale 0-10  Pain Score 0  Notify: Charge Nurse/RN  Name of Charge Nurse/RN Notified Zenaida Deed RN  Date Charge Nurse/RN Notified 06/08/22  Time Charge Nurse/RN Notified 1150  Notify: Provider  Provider Name/Title MD Courage  Date Provider Notified 06/08/22  Time Provider Notified 1151  Method of Notification Face-to-face  Notification Reason Other (Comment) Nyoka Cowden MEWS)  Provider response In department  Date of Provider Response 06/08/22  Time of Provider Response 1151  Assess: SIRS CRITERIA  SIRS Temperature  0  SIRS Pulse 1  SIRS Respirations  1  SIRS WBC 1  SIRS Score Sum  3

## 2022-06-08 NOTE — Progress Notes (Signed)
Pharmacy Antibiotic Note  Spencer Vargas is a 76 y.o. male admitted on 06/02/2022 with  necrotizing pneumonia .  Pharmacy has been consulted for zosyn dosing. WBC is elevated. Renal function ok.   Plan: Zosyn 3.375G IV q8h to be infused over 4 hours Trend WBC, temp, renal function  F/U infectious work-up    Height: 5\' 9"  (175.3 cm) Weight: 45.4 kg (100 lb 1.4 oz) IBW/kg (Calculated) : 70.7  Temp (24hrs), Avg:97.8 F (36.6 C), Min:97.5 F (36.4 C), Max:98.2 F (36.8 C)  Recent Labs  Lab 06/02/22 2203 06/03/22 0517 06/03/22 0802 06/04/22 1033 06/05/22 0341 06/06/22 0426 06/07/22 0453  WBC  --  19.5*  --  23.5* 25.1* 19.5* 16.5*  CREATININE  --  0.86  --  0.66 0.49* 0.51* 0.44*  LATICACIDVEN 2.7* 6.3* 3.0* 3.3*  --  1.7  --      Estimated Creatinine Clearance: 49.7 mL/min (A) (by C-G formula based on SCr of 0.44 mg/dL (L)).    Allergies  Allergen Reactions   Bee Venom    Paxlovid 9/27 >> Vanco 9/26  Zosyn 9/26 >> Azith 9/25 >>9/29 CTX 9/25  9/25 Bcx: ngtd 9/26 Covid: positive  9/25 Sputum Cx: pending MRSA PCR: neg  Thomasenia Sales, PharmD, [facility] Clinical Pharmacist  06/08/2022 8:03 AM

## 2022-06-09 DIAGNOSIS — Z7189 Other specified counseling: Secondary | ICD-10-CM | POA: Diagnosis not present

## 2022-06-09 DIAGNOSIS — Z515 Encounter for palliative care: Secondary | ICD-10-CM | POA: Diagnosis not present

## 2022-06-09 DIAGNOSIS — R627 Adult failure to thrive: Secondary | ICD-10-CM | POA: Diagnosis not present

## 2022-06-09 DIAGNOSIS — J850 Gangrene and necrosis of lung: Secondary | ICD-10-CM | POA: Diagnosis not present

## 2022-06-09 LAB — CBC
HCT: 21.6 % — ABNORMAL LOW (ref 39.0–52.0)
Hemoglobin: 7.4 g/dL — ABNORMAL LOW (ref 13.0–17.0)
MCH: 30.6 pg (ref 26.0–34.0)
MCHC: 34.3 g/dL (ref 30.0–36.0)
MCV: 89.3 fL (ref 80.0–100.0)
Platelets: 52 10*3/uL — ABNORMAL LOW (ref 150–400)
RBC: 2.42 MIL/uL — ABNORMAL LOW (ref 4.22–5.81)
RDW: 16.2 % — ABNORMAL HIGH (ref 11.5–15.5)
WBC: 17.5 10*3/uL — ABNORMAL HIGH (ref 4.0–10.5)
nRBC: 0.1 % (ref 0.0–0.2)

## 2022-06-09 LAB — BASIC METABOLIC PANEL
Anion gap: 2 — ABNORMAL LOW (ref 5–15)
BUN: 12 mg/dL (ref 8–23)
CO2: 29 mmol/L (ref 22–32)
Calcium: 7.6 mg/dL — ABNORMAL LOW (ref 8.9–10.3)
Chloride: 106 mmol/L (ref 98–111)
Creatinine, Ser: 0.5 mg/dL — ABNORMAL LOW (ref 0.61–1.24)
GFR, Estimated: >60 mL/min (ref >60)
Glucose, Bld: 81 mg/dL (ref 70–99)
Potassium: 4.7 mmol/L (ref 3.5–5.1)
Sodium: 137 mmol/L (ref 135–145)

## 2022-06-09 MED ORDER — ALBUMIN HUMAN 25 % IV SOLN
50.0000 g | Freq: Once | INTRAVENOUS | Status: AC
  Administered 2022-06-09: 50 g via INTRAVENOUS
  Filled 2022-06-09: qty 200

## 2022-06-09 MED ORDER — FUROSEMIDE 10 MG/ML IJ SOLN
20.0000 mg | Freq: Every day | INTRAMUSCULAR | Status: DC
  Administered 2022-06-09 – 2022-06-13 (×5): 20 mg via INTRAVENOUS
  Filled 2022-06-09 (×5): qty 2

## 2022-06-09 NOTE — TOC Progression Note (Signed)
Transition of Care [facility] - Progression Note    Patient Details  Name: Spencer Vargas MRN: [id number] Date of Birth: [birth year]  Transition of Care [facility]) CM/SW Contact  Boneta Lucks, RN Phone Number: 06/09/2022, 10:45 AM  Clinical Narrative:   Patient agreed to SNF choices given, Insurance Josem Kaufmann will be started on [facility] when patient is ready. They can not admit until 10/6 for COVID+     Expected Discharge Plan: [place] Barriers to Discharge: Continued Medical Work up  Expected Discharge Plan and Services Expected Discharge Plan: [place] In-house Referral: Hospice / [place] arrangements for the past 2 months: [place]

## 2022-06-09 NOTE — Progress Notes (Addendum)
Palliative:  HPI: 76 y.o. male  with past medical history of no significant medical history, noted to not seen a physician in about 15 years or more admitted on 06/02/2022 with necrotizing pneumonia. Overall failure to thrive.    I met today with Mr. Samuelson today after discussing with Dr. Roderic Palau and RN. Spencer Vargas is lying in bed. He is weak and frail with severe cachexia but is alert and without distress. He is slow to respond but overall with appropriate responses. He is aware that he is in the hospital. I discussed with him his status and he reports that he is feeling better. I discussed with him that he needs to be able to eat and drink more in order to recover from his infection and illness. He reports that he will work on this. He is open to rehab stay. He reports that he is okay with medication and IV fluids to try and help him improve - although he reports being tired of taking so many pills. He confirms that he does not like going to the doctor or taking medications but he is okay with being treated in the hospital. He does NOT desire any aggressive or invasive interventions, surgeries, or machines. He would like to treat what we can to help him. I discussed with him hospice (he does not seem familiar) but he would be open to hospice to help him manage symptoms at end of life.   Al questions/concerns addressed. Emotional support provided.   Exam: Alert, mostly oriented and appropriate. Severe muscle wasting and cachexia. No distress. Jaundice. Breathing regular, unlabored. Abd soft.   Plan: - DNR already decided.  - Treat potentially reversible conditions. - Okay for SNF rehab. - No desire for escalation to aggressive/invasive interventions.  - Would benefit from palliative to follow at SNF as he will likely benefit from hospice support in the near future.    [place], NP Palliative Medicine Team Pager [phone] (Please see [email or web address] for schedule) Team Phone  [phone]    Greater than 50%  of this time was spent counseling and coordinating care related to the above assessment and plan
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
## 2022-06-10 DIAGNOSIS — J850 Gangrene and necrosis of lung: Secondary | ICD-10-CM | POA: Diagnosis not present

## 2022-06-10 DIAGNOSIS — D649 Anemia, unspecified: Secondary | ICD-10-CM

## 2022-06-10 LAB — HEMOGLOBIN AND HEMATOCRIT, BLOOD
HCT: 19 % — ABNORMAL LOW (ref 39.0–52.0)
HCT: 22.4 % — ABNORMAL LOW (ref 39.0–52.0)
Hemoglobin: 6.5 g/dL — CL (ref 13.0–17.0)
Hemoglobin: 8.3 g/dL — ABNORMAL LOW (ref 13.0–17.0)

## 2022-06-10 LAB — CBC
HCT: 18.4 % — ABNORMAL LOW (ref 39.0–52.0)
Hemoglobin: 6.5 g/dL — CL (ref 13.0–17.0)
MCH: 31.7 pg (ref 26.0–34.0)
MCHC: 35.3 g/dL (ref 30.0–36.0)
MCV: 89.8 fL (ref 80.0–100.0)
Platelets: 59 10*3/uL — ABNORMAL LOW (ref 150–400)
RBC: 2.05 MIL/uL — ABNORMAL LOW (ref 4.22–5.81)
RDW: 16 % — ABNORMAL HIGH (ref 11.5–15.5)
WBC: 12.9 10*3/uL — ABNORMAL HIGH (ref 4.0–10.5)
nRBC: 0 % (ref 0.0–0.2)

## 2022-06-10 LAB — RENAL FUNCTION PANEL
Albumin: 2.1 g/dL — ABNORMAL LOW (ref 3.5–5.0)
Anion gap: 8 (ref 5–15)
BUN: 9 mg/dL (ref 8–23)
CO2: 28 mmol/L (ref 22–32)
Calcium: 7.8 mg/dL — ABNORMAL LOW (ref 8.9–10.3)
Chloride: 101 mmol/L (ref 98–111)
Creatinine, Ser: 0.43 mg/dL — ABNORMAL LOW (ref 0.61–1.24)
GFR, Estimated: >60 mL/min (ref >60)
Glucose, Bld: 75 mg/dL (ref 70–99)
Phosphorus: 2.8 mg/dL (ref 2.5–4.6)
Potassium: 4 mmol/L (ref 3.5–5.1)
Sodium: 137 mmol/L (ref 135–145)

## 2022-06-10 LAB — ABO/RH: ABO/RH(D): B NEG

## 2022-06-10 LAB — MAGNESIUM: Magnesium: 1.8 mg/dL (ref 1.7–2.4)

## 2022-06-10 LAB — C-REACTIVE PROTEIN: CRP: 6.8 mg/dL — ABNORMAL HIGH (ref <1.0)

## 2022-06-10 MED ORDER — SODIUM CHLORIDE 0.9% IV SOLUTION: Freq: Once | INTRAVENOUS | Status: AC

## 2022-06-10 MED ORDER — HALOPERIDOL LACTATE 5 MG/ML IJ SOLN
1.0000 mg | Freq: Four times a day (QID) | INTRAMUSCULAR | Status: DC | PRN
  Administered 2022-06-10: 1 mg via INTRAVENOUS
  Filled 2022-06-10: qty 1
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
## 2022-06-10 NOTE — Progress Notes (Signed)
Date and time results received: 06/10/22 0600 (use smartphrase ".now" to insert current time)  Test: HGB  Critical Value: 6.5  Name of Provider Notified: Zierle-Ghosh  Orders Received? Or Actions Taken?: Awaiting new orders

## 2022-06-10 NOTE — TOC Progression Note (Signed)
Transition of Care [facility]) - Progression Note    Patient Details  Name: TALON REGALA MRN: [id number] Date of Birth: 16-Jan-1946  [facility] of [facility]) CM/SW Contact  Boneta Lucks, RN Phone Number: 06/10/2022, 11:34 AM  Clinical Narrative:   Edmonia Lynch with DSS coming today to assess patient. Patient needing blood, they advised to get two MD's consent to blood. They are continuing to work on this case.   Expected Discharge Plan: Skilled Nursing Facility Barriers to Discharge: Continued Medical Work up  Expected Discharge Plan and Services Expected Discharge Plan: [place] In-house Referral: Hospice / [place] arrangements for the past 2 months: [place]

## 2022-06-10 NOTE — Progress Notes (Signed)
Chart review demonstrating worsening anemia; patient demonstrating lack of capacity for decision-making at this moment and there is no presence of HCPOA.  Agreed the transfusion of 1 unit PRBCs will help with further stabilization of his condition and provide symptomatic improvement.  Barton Dubois MD [phone]

## 2022-06-10 NOTE — Progress Notes (Signed)
Patient needs blood and unable to sign consent hisself. Writer called Spencer Vargas which is in patient's chart to obtain consent no answer left name and call back number to get blood consent obtained.
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
## 2022-06-10 NOTE — Progress Notes (Signed)
Pharmacy Antibiotic Note  Spencer Vargas is a 76 y.o. male admitted on 06/02/2022 with  necrotizing pneumonia .  Pharmacy has been consulted for Vancomycin/Zosyn dosing. WBC is elevated. Renal function ok.   Plan: Zosyn 3.375G IV q8h to be infused over 4 hours Plan is for 10 days of therapy Trend WBC, temp, renal function      Height: 5\' 9"  (175.3 cm) Weight: 45.4 kg (100 lb 1.4 oz) IBW/kg (Calculated) : 70.7  Temp (24hrs), Avg:97.8 F (36.6 C), Min:97.6 F (36.4 C), Max:97.9 F (36.6 C)  Recent Labs  Lab 06/04/22 1033 06/05/22 0341 06/06/22 0426 06/07/22 0453 06/09/22 0557 06/10/22 0409  WBC 23.5* 25.1* 19.5* 16.5* 17.5* 12.9*  CREATININE 0.66 0.49* 0.51* 0.44* 0.50* 0.43*  LATICACIDVEN 3.3*  --  1.7  --   --   --      Estimated Creatinine Clearance: 49.7 mL/min (A) (by C-G formula based on SCr of 0.43 mg/dL (L)).    Allergies  Allergen Reactions   Bee Venom    Paxlovid 9/27 >> Vanco 9/26  Zosyn 9/26 >> Azith 9/25 >>9/29 CTX 9/25  9/25 Bcx: ng 9/25 Sputum Cx: pending MRSA PCR: neg   Margot Ables, PharmD Clinical Pharmacist 06/10/2022 1:27 PM

## 2022-06-11 DIAGNOSIS — J850 Gangrene and necrosis of lung: Secondary | ICD-10-CM | POA: Diagnosis not present

## 2022-06-11 LAB — TYPE AND SCREEN
ABO/RH(D): B NEG
Antibody Screen: NEGATIVE
Unit division: 0

## 2022-06-11 LAB — CBC
HCT: 23.9 % — ABNORMAL LOW (ref 39.0–52.0)
Hemoglobin: 8.5 g/dL — ABNORMAL LOW (ref 13.0–17.0)
MCH: 30.7 pg (ref 26.0–34.0)
MCHC: 35.6 g/dL (ref 30.0–36.0)
MCV: 86.3 fL (ref 80.0–100.0)
Platelets: 80 10*3/uL — ABNORMAL LOW (ref 150–400)
RBC: 2.77 MIL/uL — ABNORMAL LOW (ref 4.22–5.81)
RDW: 15.8 % — ABNORMAL HIGH (ref 11.5–15.5)
WBC: 14.9 10*3/uL — ABNORMAL HIGH (ref 4.0–10.5)
nRBC: 0 % (ref 0.0–0.2)

## 2022-06-11 LAB — BASIC METABOLIC PANEL
Anion gap: 12 (ref 5–15)
BUN: 10 mg/dL (ref 8–23)
CO2: 28 mmol/L (ref 22–32)
Calcium: 7.7 mg/dL — ABNORMAL LOW (ref 8.9–10.3)
Chloride: 95 mmol/L — ABNORMAL LOW (ref 98–111)
Creatinine, Ser: 0.47 mg/dL — ABNORMAL LOW (ref 0.61–1.24)
GFR, Estimated: >60 mL/min (ref >60)
Glucose, Bld: 84 mg/dL (ref 70–99)
Potassium: 3.7 mmol/L (ref 3.5–5.1)
Sodium: 135 mmol/L (ref 135–145)

## 2022-06-11 LAB — BPAM RBC
Blood Product Expiration Date: 202310262359
ISSUE DATE / TIME: 202310031035
Unit Type and Rh: 1700

## 2022-06-11 LAB — MAGNESIUM: Magnesium: 1.7 mg/dL (ref 1.7–2.4)

## 2022-06-11 NOTE — Progress Notes (Signed)
Physical Therapy Treatment Patient Details Name: Spencer Vargas MRN: [id number] DOB: 08-30-1946 Today's Date: 06/11/2022   History of Present Illness Spencer Vargas is a 76 y.o. cachectic male with no significant medical history presents to the emergency department after being brought in by a neighbor due to increased weakness.  Patient has not seen any physician in more than 15 years.  Patient was unable to provide history possibly due to his current acute illness states, history was obtained from ED physician and neighbor at bedside, per report, patient has productive cough of greenish and yellowish phlegm for more than 1 month and has been having an increasing shortness of breath on exertion over the last several weeks.  Patient was reported to be very active and was able to mow lawns as of early summer, however, he has had significant weight loss within the last few months.  Patient denies chest pain, fever, headache, nausea, vomiting.    PT Comments    Patient demonstrates slow labored movement for sitting up at bedside, has difficulty completing sit to stands due to BLE weakness, slightly increased endurance/distance for taking steps at bedside using RW and limited mostly due to easily fatiguing.  Patient on room air with SpO2 at 93% during activity and tolerated sitting up in chair after therapy - RN notified.  Patient will benefit from continued skilled physical therapy in hospital and recommended venue below to increase strength, balance, endurance for safe ADLs and gait.     Recommendations for follow up therapy are one component of a multi-disciplinary discharge planning process, led by the attending physician.  Recommendations may be updated based on patient status, additional functional criteria and insurance authorization.  Follow Up Recommendations  Skilled nursing-short term rehab (<3 hours/day) Can patient physically be transported by private vehicle: Yes   Assistance  Recommended at Discharge Intermittent Supervision/Assistance  Patient can return home with the following A lot of help with walking and/or transfers;A lot of help with bathing/dressing/bathroom;Help with stairs or ramp for entrance;Assistance with cooking/housework   Equipment Recommendations  None recommended by PT    Recommendations for Other Services       Precautions / Restrictions Precautions Precautions: Fall Restrictions Weight Bearing Restrictions: No     Mobility  Bed Mobility Overal bed mobility: Needs Assistance Bed Mobility: Rolling, Sidelying to Sit Rolling: Min guard, Min assist Sidelying to sit: Min assist, Mod assist       General bed mobility comments: good return for rolling to side, slow labored movement for sitting up from sidelying positon    Transfers Overall transfer level: Needs assistance Equipment used: Rolling walker (2 wheels) Transfers: Sit to/from Stand, Bed to chair/wheelchair/BSC Sit to Stand: Min assist, Mod assist   Step pivot transfers: Min assist, Mod assist       General transfer comment: most difficulty completing sit to stands due to BLE weakness    Ambulation/Gait Ambulation/Gait assistance: Mod assist, Min assist Gait Distance (Feet): 5 Feet Assistive device: Rolling walker (2 wheels) Gait Pattern/deviations: Decreased step length - right, Decreased step length - left, Decreased stride length Gait velocity: decreased     General Gait Details: slightly increased endurance/distance for taking steps at bedside with slow labored movement and limited mostly due to fatigue/generalized weakness   Stairs             Wheelchair Mobility    Modified Rankin (Stroke Patients Only)       Balance Overall balance assessment: Needs assistance Sitting-balance support: Feet supported,  No upper extremity supported Sitting balance-Leahy Scale: Fair Sitting balance - Comments: fair/good seated at EOB   Standing balance  support: During functional activity, No upper extremity supported, Reliant on assistive device for balance, Bilateral upper extremity supported Standing balance-Leahy Scale: Poor Standing balance comment: fair/poor using RW                            Cognition Arousal/Alertness: Awake/alert Behavior During Therapy: WFL for tasks assessed/performed Overall Cognitive Status: Within Functional Limits for tasks assessed                                          Exercises General Exercises - Lower Extremity Long Arc Quad: Seated, AROM, Strengthening, Both, 10 reps Hip Flexion/Marching: Seated, AROM, Strengthening, Both, 10 reps Toe Raises: Seated, AROM, Strengthening, Both, 10 reps Heel Raises: Seated, AROM, Strengthening, Both, 10 reps    General Comments        Pertinent Vitals/Pain Pain Assessment Pain Assessment: No/denies pain    Home Living                          Prior Function            PT Goals (current goals can now be found in the care plan section) Acute Rehab PT Goals Patient Stated Goal: return home PT Goal Formulation: With patient Time For Goal Achievement: 06/20/22 Potential to Achieve Goals: Fair Progress towards PT goals: Progressing toward goals    Frequency    Min 3X/week      PT Plan Current plan remains appropriate    Co-evaluation              AM-PAC PT "6 Clicks" Mobility   Outcome Measure  Help needed turning from your back to your side while in a flat bed without using bedrails?: A Little Help needed moving from lying on your back to sitting on the side of a flat bed without using bedrails?: A Lot Help needed moving to and from a bed to a chair (including a wheelchair)?: A Lot Help needed standing up from a chair using your arms (e.g., wheelchair or bedside chair)?: A Lot Help needed to walk in hospital room?: A Lot Help needed climbing 3-5 steps with a railing? : A Lot 6 Click Score:  13    End of Session   Activity Tolerance: Patient tolerated treatment well;Patient limited by fatigue Patient left: in chair;with call bell/phone within reach Nurse Communication: Mobility status PT Visit Diagnosis: Unsteadiness on feet (R26.81);Other abnormalities of gait and mobility (R26.89);Muscle weakness (generalized) (M62.81)     Time: 2952-8413 PT Time Calculation (min) (ACUTE ONLY): 27 min  Charges:  $Therapeutic Exercise: 8-22 mins $Therapeutic Activity: 8-22 mins                     12:28 PM, 06/11/22 Lonell Grandchild, MPT Physical Therapist with [facility] [phone] office [phone] mobile phone

## 2022-06-11 NOTE — Progress Notes (Signed)
Patient slept throughout  the night, took medications without complaints of pain. Continued to monitor patient.

## 2022-06-11 NOTE — Progress Notes (Signed)
Speech Language Pathology Treatment: Dysphagia  Patient Details Name: Spencer Vargas MRN: [id number] DOB: Oct 29, 1945 Today's Date: 06/11/2022 Time: 7793-9030 SLP Time Calculation (min) (ACUTE ONLY): 24 min  Assessment / Plan / Recommendation Clinical Impression  Ongoing diagnostic dysphagia therapy provided today; Pt was sitting upright in bed and reports he is not eating much because he's "not hungry". RN reports very little PO intake. Pt consumed thin liquids and demonstrated occasional wet vocal quality and note occasional congested sounding coughing after the swallow, however, Pt also has a congested sounding cough at baseline. Pt consumed limited regular and puree trials without overt s/sx of aspiration, however note overall lethargy and slow oral prep stage. Recommend consider MBSS secondary to inconsistent presentation, however, if Pt is not receptive to diet adjustment this may not be of benefit. Pt indicated to me that he wants to be able to have his sodas. ST will follow tomorrow for goals of care and possible MBSS as schedule permits, if indicated. Thank you,    HPI HPI: Spencer Vargas is a 76 y.o. cachectic male with no significant medical history presents to the emergency department after being brought in by a neighbor due to increased weakness.  Patient was admitted with sepsis secondary to necrotizing pneumonia and is noted to have pneumomediastinum as well as pneumothorax.  He appears near end-of-life and is quite frail.  Pulmonology and palliative consulted.  He is now a DO NOT RESUSCITATE and appears to be clinically improving. BSE requested.      SLP Plan  Continue with current plan of care      Recommendations for follow up therapy are one component of a multi-disciplinary discharge planning process, led by the attending physician.  Recommendations may be updated based on patient status, additional functional criteria and insurance authorization.    Recommendations   Diet recommendations: Dysphagia 1 (puree);Thin liquid Liquids provided via: Cup;Straw Medication Administration: Whole meds with puree Supervision: Patient able to self feed Compensations: Slow rate;Small sips/bites                Oral Care Recommendations: Oral care BID;Staff/trained caregiver to provide oral care Follow Up Recommendations: Skilled nursing-short term rehab (<3 hours/day) Assistance recommended at discharge: Frequent or constant Supervision/Assistance SLP Visit Diagnosis: Dysphagia, unspecified (R13.10) Plan: Continue with current plan of care          Spencer Vargas, CCC-SLP Speech Language Pathologist  Wende Bushy  06/11/2022, 4:03 PM
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
## 2022-06-12 DIAGNOSIS — J850 Gangrene and necrosis of lung: Secondary | ICD-10-CM | POA: Diagnosis not present

## 2022-06-12 DIAGNOSIS — Z7189 Other specified counseling: Secondary | ICD-10-CM | POA: Diagnosis not present

## 2022-06-12 DIAGNOSIS — Z515 Encounter for palliative care: Secondary | ICD-10-CM | POA: Diagnosis not present

## 2022-06-12 DIAGNOSIS — R627 Adult failure to thrive: Secondary | ICD-10-CM | POA: Diagnosis not present

## 2022-06-12 MED ORDER — PIPERACILLIN-TAZOBACTAM 3.375 G IVPB
3.3750 g | Freq: Three times a day (TID) | INTRAVENOUS | Status: AC
  Administered 2022-06-12 (×2): 3.375 g via INTRAVENOUS
  Filled 2022-06-12 (×2): qty 50
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
## 2022-06-12 NOTE — Progress Notes (Signed)
Palliative:  HPI: 76 y.o. male  with past medical history of no significant medical history, noted to not seen a physician in about 15 years or more admitted on 06/02/2022 with necrotizing pneumonia. Overall failure to thrive.    I met today at Spencer Vargas's bedside along with his close friend and neighbor, Marketing executive. Spencer Vargas is very supportive of Spencer Vargas. She shares that they have been friends for over 100 years. We discussed his declining health and path forward. Spencer Vargas shares that they have had multiple conversations over the years about his wishes. She shares that she even knows his wishes for funeral arrangements. Spencer Vargas shares that she is willing to support Spencer Vargas in any way that she can and he would even be invited to stay in their home if needed.   We discussed his frailty and wishes. Spencer Vargas does not recall the conversation we had a couple days ago. He continues with his wishes for conservative measures to support him but no desire for invasive or aggressive interventions. He is getting tired even now of having lab sticks frequently. He is looking forward to going to rehab and getting out of the hospital. We discussed the potential of hospice and they have not discussed this yet. Spencer Vargas confirms she will support Spencer Vargas in his wishes and is willing to help him to improve as much as possible.   I spoke with Spencer Vargas who expresses concern and wanting to make sure that she is supporting Spencer Vargas in the right way. She asks if encouraging him to eat and drink and get better is the right thing for him. We discussed challenges of improving moving forward. I explained that encouraging him is good but also recognizing his physical limits is important as well. I encouraged her to just listen and pay attention to her friend's overall health and we will add palliative support to continue to help them know the best path forward and expectations.   I spoke briefly with DSS representative here to visit. No  significant concerns from my standpoint. Spoke with chaplain [place] who will try and complete HCPOA/Living Will tomorrow once isolation is completed.   All questions/concerns addressed. Emotional support provided.   Exam: Alert, mostly oriented and appropriate. Severe muscle wasting and cachexia. No distress. Breathing regular, unlabored. Abd soft.   Plan: -  DNR already decided.  - Treat potentially reversible conditions. - No desire for escalation to aggressive/invasive interventions.  - Would benefit from palliative to follow at SNF as he will likely benefit from hospice support in the near future.    [place], NP Palliative Medicine Team Pager [phone] (Please see [email or web address] for schedule) Team Phone [phone]    Greater than 50%  of this time was spent counseling and coordinating care related to the above assessment and plan

## 2022-06-12 NOTE — TOC Progression Note (Signed)
Transition of Care [facility]) - Progression Note    Patient Details  Name: Spencer Vargas MRN: [id number] Date of Birth: [birth year]  [facility]) CM/SW Contact  Boneta Lucks, RN Phone Number: 06/12/2022, 12:26 PM  Clinical Narrative:   Confirmed Spencer Vargas is ready for patient tomorrow. Ask them to add Outpatient palliative services as per request from out palliative services. Spencer Vargas wit DSS will continue to follow patient. At this time they will not pursue guardianship. Patient does have intermittent times of confusion. She had a long conversation with him and believes he understood and agreed to have Spencer Vargas, neighbor involved in his care a decisions. TOC consulted Chaplain to get healthcare POA today with Spencer Vargas. They both are on their way here. RN updated.  Expected Discharge Plan: [place] Barriers to Discharge: Continued Medical Work up  Expected Discharge Plan and Services Expected Discharge Plan: [place] In-house Referral: Hospice / [place] arrangements for the past 2 months: Single Family Home                   Readmission Risk Interventions    06/12/2022   12:24 PM  Readmission Risk Prevention Plan  Transportation Screening Complete  PCP or Specialist Appt within 5-7 Days Complete

## 2022-06-12 NOTE — Progress Notes (Signed)
Met with patient and his emergency contact Patsy today. Unable to complete documents today due to contact precautions and inability to have notary and witnesses present while patient is on them. Patient and Tessie Fass stated that this will give them some time to go over the paperwork and the goal is to have the document notarized and witnessed before he is discharged to [facility] tomorrow. Patient is set to go off contact precautions tomorrow, allowing this to be done in the morning.  Chaplain will remain available in order to provide support and assess for spiritual need.   Rev. Bennie Pierini, M.[employer]  [phone]
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
## 2022-06-13 DIAGNOSIS — J850 Gangrene and necrosis of lung: Secondary | ICD-10-CM | POA: Diagnosis not present

## 2022-06-13 MED ORDER — ENSURE ENLIVE PO LIQD: 237.0000 mL | Freq: Two times a day (BID) | ORAL | 12 refills | Status: AC

## 2022-06-13 MED ORDER — GUAIFENESIN-DM 100-10 MG/5ML PO SYRP: 5.0000 mL | ORAL_SOLUTION | ORAL | 0 refills | Status: AC | PRN

## 2022-06-13 NOTE — Care Management Important Message (Signed)
Important Message  Patient Details  Name: Spencer Vargas MRN: [id number] Date of Birth: [birth year]   Medicare Important Message Given:  Yes     Tommy Medal 06/13/2022, 11:57 AM

## 2022-06-13 NOTE — TOC Transition Note (Signed)
Transition of Care [facility]) - CM/SW Discharge Note   Patient Details  Name: Spencer Vargas MRN: [id number] Date of Birth: 1945-11-03  [facility]) CM/SW Contact:  Salome Arnt, LCSW Phone Number: 06/13/2022, 11:03 AM   Clinical Narrative:  Pt d/c today to [facility]. Pt's friend, Patsy aware and agreeable and will transport. D/C summary sent to SNF. RN given number to call report. Authorization received.     Final next level of care: Skilled Nursing Facility Barriers to Discharge: Barriers Resolved   Patient Goals and CMS Choice Patient states their goals for this hospitalization and ongoing recovery are:: continuing medical workup      Discharge Placement              Patient chooses bed at: [facility] Patient to be transferred to facility by: [place] Name of family member notified: Patsy Patient and family notified of of transfer: 06/13/22  Discharge Plan and Services In-house Referral: Hospice / [place] Determinants of Health (SDOH) Interventions     Readmission Risk Interventions    06/12/2022   12:24 PM  Readmission Risk Prevention Plan  Transportation Screening Complete  PCP or Specialist Appt within 5-7 Days Complete

## 2022-06-13 NOTE — Progress Notes (Addendum)
Nutrition Follow-up  DOCUMENTATION CODES:   Severe malnutrition in context of acute illness/injury  Underweight  INTERVENTION:  Magic cups with meals  Provide tray set up and encourage meal and supplement intake  Milk with meals  NUTRITION DIAGNOSIS:   Severe Malnutrition related to catabolic illness, acute illness as evidenced by severe fat depletion, severe muscle depletion.  -Ongoing with meal intake 0-25% of all but one meal since admission  GOAL:   Patient will meet greater than or equal to 90% of their needs (if feasible given the nature of his illness)  -not met  MONITOR:   PO intake, Supplement acceptance, Labs  ASSESSMENT:   Patient is an underweight 76 yo male who presents from home with necrotizing pneumonia.  Patient is lying in bed at RD visit -says "I've made a mess on myself". Nursing called and came in to clean him before breakfast.   Patient is more clear in conversation today than initial meeting. Assisted with tray set up and brought a Strawberry Ensure for him to taste. Nursing note he ate all on magic cup yesterday but none of meal. If poor intake persists recommend revisit with patient alternate nutrition support options.    Patsy is here with clean clothes. Hopefully she will be able to encourage acceptance of meal and ONS while she is here.   Diet Order:   Diet Order             DIET - DYS 1 Room service appropriate? Yes; Fluid consistency: Thin  Diet effective now                   EDUCATION NEEDS:   Not appropriate for education at this time  Skin:  Skin Assessment: Reviewed RN Assessment  Last BM:  10/6 - large type 7  Height:   Ht Readings from Last 1 Encounters:  06/02/22 5' 9"  (1.753 m)    Weight:   Wt Readings from Last 1 Encounters:  06/03/22 45.4 kg    Ideal Body Weight:   66 kg  BMI:  Body mass index is 14.78 kg/m.  Estimated Nutritional Needs:   Kcal:  1600-1700  Protein:  73-82 gr  Fluid:  >1500  ml daily   Colman Cater MS,RD,CSG,LDN Contact: Shea Evans

## 2022-06-13 NOTE — Progress Notes (Signed)
Patient discharged to [facility] report called and given to Leonidas Romberg LPN. Transported by [facility] to awaiting facility. Accompanied by staff to an awaiting van. IV discontinued, Catheter intact.

## 2022-06-13 NOTE — TOC Progression Note (Signed)
Transition of Care [facility]) - Progression Note    Patient Details  Name: Spencer Vargas MRN: [id number] Date of Birth: Mar 11, 1946  Transition of Care [facility]) CM/SW Contact  Shade Flood, LCSW Phone Number: 06/13/2022, 12:30 PM  Clinical Narrative:     TOC informed that pt's sig other no longer feels comfortable driving pt to [facility] and requests transport be arranged. TOC made arrangements with [facility] for w/c transport. They will pick up at the main entrance between 145-200. Updated RN.  Expected Discharge Plan: [place] Barriers to Discharge: Barriers Resolved  Expected Discharge Plan and Services Expected Discharge Plan: [place] In-house Referral: Hospice / [place] arrangements for the past 2 months: Single Family Home Expected Discharge Date: 06/13/22                                     Social Determinants of Health (SDOH) Interventions    Readmission Risk Interventions    06/12/2022   12:24 PM  Readmission Risk Prevention Plan  Transportation Screening Complete  PCP or Specialist Appt within 5-7 Days Complete

## 2022-06-13 NOTE — Discharge Summary (Signed)
Physician Discharge Summary  Spencer Vargas WVP:[id number] DOB: [birth year] DOA: 06/02/2022  PCP: Gareth Morgan, MD  Admit date: 06/02/2022  Discharge date: 06/13/2022  Admitted From:Home  Disposition:  SNF  Recommendations for Outpatient Follow-up:  Follow up with PCP in 1-2 weeks Continue on feeding supplements and antitussives as noted below Completed course of antibiotics for COVID infection and necrotizing pneumonia with Paxlovid and 10-day course of IV Zosyn respectively He would benefit from outpatient palliative consultation  Home Health: None  Equipment/Devices: None  Discharge Condition:Stable  CODE STATUS: DNR  Diet recommendation: Heart Healthy  Brief/Interim Summary: Spencer Vargas is a 76 y.o. cachectic male with no significant medical history presents to the emergency department after being brought in by a neighbor due to increased weakness.  Patient was admitted with sepsis secondary to necrotizing pneumonia and is noted to have pneumomediastinum as well as pneumothorax.  He was also noted to be COVID-positive and was placed on isolation precautions and treated with Paxlovid for this.  He also received full course of IV Zosyn for his necrotizing pneumonia.  He appears overall quite frail, but has clinically recovered quite well.  PT recommended SNF placement and he is now in stable condition for discharge today.  He was seen by pulmonology as well as palliative care during the course of this admission.  No other acute events or concerns otherwise noted and he has completed a 10-day course of IV Zosyn treatment.  Discharge Diagnoses:  Principal Problem:   Necrotizing pneumonia (HCC) Active Problems:   Lactic acidosis   Failure to thrive in adult   Dehydration   Weight loss   Hypoalbuminemia due to protein-calorie malnutrition (HCC)   Pneumothorax   Pneumomediastinum (HCC)   Pneumonia   Protein-calorie malnutrition, severe  Principal discharge diagnosis:  Sepsis, present on admission secondary to necrotizing pneumonia along with symptomatic COVID infection.  Associated pneumothorax and pneumomediastinum.  Discharge Instructions  Discharge Instructions     Diet - low sodium heart healthy   Complete by: As directed    Increase activity slowly   Complete by: As directed       Allergies as of 06/13/2022       Reactions   Bee Venom         Medication List     TAKE these medications    feeding supplement Liqd Take 237 mLs by mouth 2 (two) times daily between meals.   guaiFENesin-dextromethorphan 100-10 MG/5ML syrup Commonly known as: ROBITUSSIN DM Take 5 mLs by mouth every 4 (four) hours as needed for cough (chest congestion).        Contact information for follow-up providers     Gareth Morgan, MD. Schedule an appointment as soon as possible for a visit in 2 week(s).   Specialty: Family Medicine Contact information: [address] [place] [address] [phone]              Contact information for after-discharge care     Destination     [facility] .   Service: Skilled Nursing Contact information: [address] [place] [address] [phone]                    Allergies  Allergen Reactions   Bee Venom     Consultations: Pulmonology Palliative care   Procedures/Studies: CT Chest W Contrast  Result Date: 06/02/2022 CLINICAL DATA:  Pneumonia, complication suspected. Abnormal chest x-ray. EXAM: CT CHEST WITH CONTRAST TECHNIQUE:  Multidetector CT imaging of the chest was performed during intravenous contrast administration. RADIATION DOSE REDUCTION: This exam was performed according to the departmental dose-optimization program which includes automated exposure control, adjustment of the mA and/or kV according to patient size and/or use of iterative reconstruction technique. CONTRAST:  76mL OMNIPAQUE IOHEXOL 300 MG/ML   SOLN COMPARISON:  None Available. FINDINGS: Cardiovascular: Cardiac size within normal limits. No significant coronary artery calcification. No pericardial effusion. Central pulmonary arteries are of normal caliber. The thoracic aorta is unremarkable. Mediastinum/Nodes: There is extensive pneumomediastinum with gas seen tracking into the neck base bilaterally and within the left apical subpleural space. The visualized thyroid is unremarkable. No pathologic thoracic adenopathy. The esophagus is unremarkable. There is extensive layering debris within the distal trachea and within the right mainstem bronchus. Lungs/Pleura: There is asymmetric right-sided volume loss. There is extensive asymmetric architectural distortion within the right lung demonstrating subpleural and basilar predominance. There is extensive cystic change within these regions which are in keeping with advanced varicoid and cystic bronchiectasis, best appreciated on coronal imaging. This may reflect the sequela of remote or recurrent infection. There is superimposed extensive consolidation within the a right lower lobe with central areas of cavitation in keeping with superimposed necrotizing pneumonia. Scattered infiltrate is also seen within the right upper lobe. Within the left lung, there is mild, scattered areas of subpleural architectural distortion, traction bronchiolectasis, and inter and intra lobular septal thickening in keeping with mild scattered areas of subpleural fibrosis. Findings may relate to underlying interstitial lung disease that this is not well assessed given the overlying acute inflammatory process. Small left pneumothorax is present without associated mediastinal shift or hyperexpansion to suggest tension physiology. No pleural effusion. Upper Abdomen: No acute abnormality. Musculoskeletal: No acute bone abnormality. No lytic or blastic bone lesion. IMPRESSION: 1. Extensive pneumomediastinum with gas seen tracking into the  neck base bilaterally and within the left apical subpleural space. 2. Small left pneumothorax without associated mediastinal shift or hyperexpansion to suggest tension physiology. 3. Extensive consolidation within the right lower lobe with central areas of cavitation in keeping with necrotizing pneumonia. Extensive debris within the dependent trachea and right mainstem bronchus without evidence of central obstructing lesion. 4. Extensive asymmetric architectural distortion within the right lung with extensive cystic change in keeping with advanced varicoid and cystic bronchiectasis. This may reflect the sequela of remote and/or recurrent infection. 5. Scattered areas of subpleural architectural distortion, traction bronchiolectasis, and inter and intra lobular septal thickening within the left lung in keeping with mild scattered areas of subpleural fibrosis. Findings may relate to underlying interstitial lung disease that this is not well assessed given the overlying acute inflammatory process. High-resolution CT examination of the chest may be helpful for further evaluation once the patient's acute issues have resolved. Electronically Signed   By: Fidela Salisbury M.D.   On: 06/02/2022 23:22   DG Chest 2 View  Result Date: 06/02/2022 CLINICAL DATA:  Suspect sepsis EXAM: CHEST - 2 VIEW COMPARISON:  Chest x-ray 04/09/2012 FINDINGS: There is diffuse right lung airspace disease with dense consolidation in the right mid lung. The cardiomediastinal silhouette is within normal limits for degree of patient rotation. There is no pleural effusion or pneumothorax. Questionable small nodular density in the left lung apex. No acute fractures are seen. IMPRESSION: 1. Diffuse right lung airspace disease with dense consolidation in the right mid lung. Findings are worrisome for infection. Follow-up short-term chest x-ray recommended to confirm resolution. 2. Questionable nodular density in the left lung  apex nonemergent chest CT  recommended. Electronically Signed   By: Darliss Cheney M.D.   On: 06/02/2022 19:45     Discharge Exam: Vitals:   06/12/22 2325 06/13/22 0517  BP: 134/77 137/71  Pulse: 80 74  Resp: 16 16  Temp:  97.9 F (36.6 C)  SpO2: 96% 96%   Vitals:   06/12/22 1245 06/12/22 2235 06/12/22 2325 06/13/22 0517  BP: (!) 145/66 (!) 140/74 134/77 137/71  Pulse: 81 81 80 74  Resp: 20 20 16 16   Temp: 97.6 F (36.4 C) (!) 97.5 F (36.4 C)  97.9 F (36.6 C)  TempSrc: Oral Oral  Oral  SpO2: 96% 97% 96% 96%  Weight:      Height:        General: Pt is alert, awake, not in acute distress, frail Cardiovascular: RRR, S1/S2 +, no rubs, no gallops Respiratory: CTA bilaterally, no wheezing, no rhonchi Abdominal: Soft, NT, ND, bowel sounds + Extremities: no edema, no cyanosis    The results of significant diagnostics from this hospitalization (including imaging, microbiology, ancillary and laboratory) are listed below for reference.     Microbiology: No results found for this or any previous visit (from the past 240 hour(s)).   Labs: BNP (last 3 results) No results for input(s): "BNP" in the last 8760 hours. Basic Metabolic Panel: Recent Labs  Lab 06/07/22 0453 06/09/22 0557 06/10/22 0409 06/11/22 0347  NA 135 137 137 135  K 3.3* 4.7 4.0 3.7  CL 99 106 101 95*  CO2 28 29 28 28   GLUCOSE 98 81 75 84  BUN 11 12 9 10   CREATININE 0.44* 0.50* 0.43* 0.47*  CALCIUM 7.3* 7.6* 7.8* 7.7*  MG 1.5*  --  1.8 1.7  PHOS  --   --  2.8  --    Liver Function Tests: Recent Labs  Lab 06/10/22 0409  ALBUMIN 2.1*   No results for input(s): "LIPASE", "AMYLASE" in the last 168 hours. No results for input(s): "AMMONIA" in the last 168 hours. CBC: Recent Labs  Lab 06/07/22 0453 06/09/22 0557 06/10/22 0409 06/10/22 0639 06/10/22 1603 06/11/22 0347  WBC 16.5* 17.5* 12.9*  --   --  14.9*  HGB 8.0* 7.4* 6.5* 6.5* 8.3* 8.5*  HCT 22.2* 21.6* 18.4* 19.0* 22.4* 23.9*  MCV 89.9 89.3 89.8  --   --  86.3   PLT 57* 52* 59*  --   --  80*   Cardiac Enzymes: No results for input(s): "CKTOTAL", "CKMB", "CKMBINDEX", "TROPONINI" in the last 168 hours. BNP: Invalid input(s): "POCBNP" CBG: No results for input(s): "GLUCAP" in the last 168 hours. D-Dimer No results for input(s): "DDIMER" in the last 72 hours. Hgb A1c No results for input(s): "HGBA1C" in the last 72 hours. Lipid Profile No results for input(s): "CHOL", "HDL", "LDLCALC", "TRIG", "CHOLHDL", "LDLDIRECT" in the last 72 hours. Thyroid function studies No results for input(s): "TSH", "T4TOTAL", "T3FREE", "THYROIDAB" in the last 72 hours.  Invalid input(s): "FREET3" Anemia work up No results for input(s): "VITAMINB12", "FOLATE", "FERRITIN", "TIBC", "IRON", "RETICCTPCT" in the last 72 hours. Urinalysis    Component Value Date/Time   COLORURINE AMBER (A) 06/02/2022 2050   APPEARANCEUR CLEAR 06/02/2022 2050   LABSPEC 1.020 06/02/2022 2050   PHURINE 5.0 06/02/2022 2050   GLUCOSEU NEGATIVE 06/02/2022 2050   HGBUR SMALL (A) 06/02/2022 2050   BILIRUBINUR SMALL (A) 06/02/2022 2050   KETONESUR 20 (A) 06/02/2022 2050   PROTEINUR 30 (A) 06/02/2022 2050   NITRITE NEGATIVE 06/02/2022 2050  LEUKOCYTESUR NEGATIVE 06/02/2022 2050   Sepsis Labs Recent Labs  Lab 06/07/22 0453 06/09/22 0557 06/10/22 0409 06/11/22 0347  WBC 16.5* 17.5* 12.9* 14.9*   Microbiology No results found for this or any previous visit (from the past 240 hour(s)).   Time coordinating discharge: 35 minutes  SIGNED:   Erick Blinks, DO [facility] 06/13/2022, 9:25 AM  If 7PM-7AM, please contact night-coverage [email or web address]

## 2022-08-11 ENCOUNTER — Ambulatory Visit (HOSPITAL_COMMUNITY)
Admission: RE | Admit: 2022-08-11 | Discharge: 2022-08-11 | Disposition: A | Discharge status: Home / Self Care | Payer: Medicare HMO | Source: Ambulatory Visit | Attending: Nurse Practitioner | Admitting: Nurse Practitioner

## 2022-08-11 ENCOUNTER — Other Ambulatory Visit (HOSPITAL_COMMUNITY): Payer: Self-pay | Admitting: Nurse Practitioner

## 2022-08-11 DIAGNOSIS — J189 Pneumonia, unspecified organism: Secondary | ICD-10-CM | POA: Diagnosis present

## 2022-12-08 DEATH — deceased
